# Patient Record
Sex: Female | Born: 1965 | Race: Black or African American | Hispanic: No | Smoking: Never smoker
Health system: Southern US, Community
[De-identification: ages and names within clinical notes are randomized; demographics above are authoritative.]

## PROBLEM LIST (undated history)

## (undated) DIAGNOSIS — D649 Anemia, unspecified: Secondary | ICD-10-CM

## (undated) DIAGNOSIS — R112 Nausea with vomiting, unspecified: Secondary | ICD-10-CM

## (undated) DIAGNOSIS — K760 Fatty (change of) liver, not elsewhere classified: Secondary | ICD-10-CM

## (undated) DIAGNOSIS — Z9889 Other specified postprocedural states: Secondary | ICD-10-CM

## (undated) DIAGNOSIS — C801 Malignant (primary) neoplasm, unspecified: Secondary | ICD-10-CM

## (undated) DIAGNOSIS — Z923 Personal history of irradiation: Secondary | ICD-10-CM

## (undated) HISTORY — PX: BREAST SURGERY: SHX581

## (undated) HISTORY — PX: TUBAL LIGATION: SHX77

---

## 2000-12-04 DIAGNOSIS — C801 Malignant (primary) neoplasm, unspecified: Secondary | ICD-10-CM

## 2000-12-04 HISTORY — DX: Malignant (primary) neoplasm, unspecified: C80.1

## 2000-12-04 HISTORY — PX: MASTECTOMY: SHX3

## 2001-06-19 ENCOUNTER — Ambulatory Visit: Admission: RE | Admit: 2001-06-19 | Discharge: 2001-09-17 | Payer: Self-pay | Admitting: Radiation Oncology

## 2002-12-16 ENCOUNTER — Encounter: Payer: Self-pay | Admitting: Specialist

## 2002-12-19 ENCOUNTER — Inpatient Hospital Stay (HOSPITAL_COMMUNITY): Admission: RE | Admit: 2002-12-19 | Discharge: 2002-12-22 | Payer: Self-pay | Admitting: Specialist

## 2016-08-18 ENCOUNTER — Other Ambulatory Visit: Payer: Self-pay | Admitting: Orthopaedic Surgery

## 2016-08-18 DIAGNOSIS — M542 Cervicalgia: Secondary | ICD-10-CM

## 2016-08-18 DIAGNOSIS — M47812 Spondylosis without myelopathy or radiculopathy, cervical region: Secondary | ICD-10-CM

## 2016-08-18 DIAGNOSIS — M79601 Pain in right arm: Secondary | ICD-10-CM

## 2016-08-25 ENCOUNTER — Other Ambulatory Visit: Payer: Self-pay | Admitting: Family Medicine

## 2016-08-25 DIAGNOSIS — N63 Unspecified lump in unspecified breast: Secondary | ICD-10-CM

## 2016-08-30 ENCOUNTER — Ambulatory Visit
Admission: RE | Admit: 2016-08-30 | Discharge: 2016-08-30 | Disposition: A | Discharge status: Home / Self Care | Payer: Managed Care, Other (non HMO) | Source: Ambulatory Visit | Attending: Family Medicine | Admitting: Family Medicine

## 2016-08-30 DIAGNOSIS — N63 Unspecified lump in unspecified breast: Secondary | ICD-10-CM

## 2016-09-03 ENCOUNTER — Ambulatory Visit
Admission: RE | Admit: 2016-09-03 | Discharge: 2016-09-03 | Disposition: A | Discharge status: Home / Self Care | Payer: Managed Care, Other (non HMO) | Source: Ambulatory Visit | Attending: Orthopaedic Surgery | Admitting: Orthopaedic Surgery

## 2016-09-03 DIAGNOSIS — M542 Cervicalgia: Secondary | ICD-10-CM

## 2016-09-03 DIAGNOSIS — M47812 Spondylosis without myelopathy or radiculopathy, cervical region: Secondary | ICD-10-CM

## 2016-09-03 DIAGNOSIS — M79601 Pain in right arm: Secondary | ICD-10-CM

## 2016-09-21 ENCOUNTER — Ambulatory Visit (INDEPENDENT_AMBULATORY_CARE_PROVIDER_SITE_OTHER): Payer: Managed Care, Other (non HMO) | Admitting: Orthopaedic Surgery

## 2016-09-21 DIAGNOSIS — M47812 Spondylosis without myelopathy or radiculopathy, cervical region: Secondary | ICD-10-CM

## 2016-10-09 ENCOUNTER — Telehealth (INDEPENDENT_AMBULATORY_CARE_PROVIDER_SITE_OTHER): Payer: Self-pay | Admitting: *Deleted

## 2016-10-09 NOTE — Telephone Encounter (Signed)
Pt. Called stating she needs proof of surgery and possible retun to work sent to [employer] in [place]. [id number]

## 2016-10-10 ENCOUNTER — Telehealth (INDEPENDENT_AMBULATORY_CARE_PROVIDER_SITE_OTHER): Payer: Self-pay | Admitting: Orthopaedic Surgery

## 2016-10-10 NOTE — Telephone Encounter (Signed)
PATIENT IS REQUESTING A LETTER STATING HER SURGICAL DATE AND WHEN PATIENT CAN RETURN BACK TO WORK.   Cb#: [phone]

## 2016-10-10 NOTE — Telephone Encounter (Signed)
Duplicate, I sent msg to [facility] to advise.

## 2016-10-10 NOTE — Telephone Encounter (Signed)
OK for job no lifting but has to keep collar on at all times.

## 2016-10-10 NOTE — Telephone Encounter (Signed)
See note

## 2016-10-10 NOTE — Telephone Encounter (Signed)
Please advise on RTW, I can send proof of surgery.

## 2016-10-11 NOTE — Telephone Encounter (Signed)
IC patient, she wants to know when you think a possible RTW will be.  She is not ready to RTW at this time.  Please advise, and I can write note for her.

## 2016-10-12 ENCOUNTER — Encounter (HOSPITAL_COMMUNITY): Payer: Self-pay | Admitting: *Deleted

## 2016-10-12 NOTE — Progress Notes (Signed)
Spoke with pt for pre-op call. Pt denies cardiac history, chest pain or sob. 

## 2016-10-12 NOTE — Telephone Encounter (Signed)
She will be out 6 wks unless someone can drive her to work and she wants to go to work with her collar on then 2 wks

## 2016-10-13 ENCOUNTER — Ambulatory Visit (HOSPITAL_COMMUNITY): Payer: Managed Care, Other (non HMO) | Admitting: Anesthesiology

## 2016-10-13 ENCOUNTER — Ambulatory Visit (HOSPITAL_COMMUNITY): Payer: Managed Care, Other (non HMO)

## 2016-10-13 ENCOUNTER — Encounter (HOSPITAL_COMMUNITY)
Admission: RE | Disposition: A | Discharge status: Home / Self Care | Payer: Self-pay | Source: Ambulatory Visit | Attending: Orthopaedic Surgery

## 2016-10-13 ENCOUNTER — Encounter (HOSPITAL_COMMUNITY): Payer: Self-pay | Admitting: *Deleted

## 2016-10-13 ENCOUNTER — Encounter (INDEPENDENT_AMBULATORY_CARE_PROVIDER_SITE_OTHER): Payer: Self-pay | Admitting: Orthopedic Surgery

## 2016-10-13 ENCOUNTER — Observation Stay (HOSPITAL_COMMUNITY)
Admission: RE | Admit: 2016-10-13 | Discharge: 2016-10-14 | Disposition: A | Discharge status: Home / Self Care | Payer: Managed Care, Other (non HMO) | Source: Ambulatory Visit | Attending: Orthopaedic Surgery | Admitting: Orthopaedic Surgery

## 2016-10-13 DIAGNOSIS — Z01818 Encounter for other preprocedural examination: Secondary | ICD-10-CM | POA: Insufficient documentation

## 2016-10-13 DIAGNOSIS — M4722 Other spondylosis with radiculopathy, cervical region: Secondary | ICD-10-CM | POA: Diagnosis not present

## 2016-10-13 DIAGNOSIS — Z853 Personal history of malignant neoplasm of breast: Secondary | ICD-10-CM | POA: Diagnosis not present

## 2016-10-13 DIAGNOSIS — Z6836 Body mass index (BMI) 36.0-36.9, adult: Secondary | ICD-10-CM | POA: Diagnosis not present

## 2016-10-13 DIAGNOSIS — Z9012 Acquired absence of left breast and nipple: Secondary | ICD-10-CM | POA: Diagnosis not present

## 2016-10-13 DIAGNOSIS — M4802 Spinal stenosis, cervical region: Secondary | ICD-10-CM | POA: Diagnosis not present

## 2016-10-13 DIAGNOSIS — Z419 Encounter for procedure for purposes other than remedying health state, unspecified: Secondary | ICD-10-CM

## 2016-10-13 DIAGNOSIS — M47812 Spondylosis without myelopathy or radiculopathy, cervical region: Secondary | ICD-10-CM | POA: Diagnosis present

## 2016-10-13 HISTORY — DX: Malignant (primary) neoplasm, unspecified: C80.1

## 2016-10-13 HISTORY — DX: Fatty (change of) liver, not elsewhere classified: K76.0

## 2016-10-13 HISTORY — DX: Anemia, unspecified: D64.9

## 2016-10-13 HISTORY — PX: ANTERIOR CERVICAL DECOMP/DISCECTOMY FUSION: SHX1161

## 2016-10-13 LAB — CBC
HCT: 37.8 % (ref 36.0–46.0)
Hemoglobin: 12.2 g/dL (ref 12.0–15.0)
MCH: 29 pg (ref 26.0–34.0)
MCHC: 32.3 g/dL (ref 30.0–36.0)
MCV: 89.8 fL (ref 78.0–100.0)
PLATELETS: 202 10*3/uL (ref 150–400)
RBC: 4.21 MIL/uL (ref 3.87–5.11)
RDW: 14.2 % (ref 11.5–15.5)
WBC: 6.4 10*3/uL (ref 4.0–10.5)

## 2016-10-13 LAB — COMPREHENSIVE METABOLIC PANEL
ALBUMIN: 4.2 g/dL (ref 3.5–5.0)
ALT: 11 U/L — AB (ref 14–54)
AST: 26 U/L (ref 15–41)
Alkaline Phosphatase: 96 U/L (ref 38–126)
Anion gap: 9 (ref 5–15)
BUN: 15 mg/dL (ref 6–20)
CHLORIDE: 109 mmol/L (ref 101–111)
CO2: 25 mmol/L (ref 22–32)
CREATININE: 0.61 mg/dL (ref 0.44–1.00)
Calcium: 8.9 mg/dL (ref 8.9–10.3)
GFR calc non Af Amer: >60 mL/min (ref >60)
GLUCOSE: 85 mg/dL (ref 65–99)
Potassium: 4 mmol/L (ref 3.5–5.1)
SODIUM: 143 mmol/L (ref 135–145)
Total Bilirubin: 0.7 mg/dL (ref 0.3–1.2)
Total Protein: 7.5 g/dL (ref 6.5–8.1)

## 2016-10-13 LAB — PROTIME-INR
INR: 0.95
Prothrombin Time: 12.7 seconds (ref 11.4–15.2)

## 2016-10-13 LAB — SURGICAL PCR SCREEN
MRSA, PCR: NEGATIVE
STAPHYLOCOCCUS AUREUS: NEGATIVE

## 2016-10-13 LAB — APTT: aPTT: 33 seconds (ref 24–36)

## 2016-10-13 SURGERY — ANTERIOR CERVICAL DECOMPRESSION/DISCECTOMY FUSION 2 LEVELS
Anesthesia: General | Site: Spine Cervical

## 2016-10-13 MED ORDER — SODIUM CHLORIDE 0.9% FLUSH: 3.0000 mL | INTRAVENOUS | Status: DC | PRN

## 2016-10-13 MED ORDER — SODIUM CHLORIDE 0.9 % IV SOLN
INTRAVENOUS | Status: DC
  Administered 2016-10-14: 75 mL/h via INTRAVENOUS

## 2016-10-13 MED ORDER — PHENOL 1.4 % MT LIQD: 1.0000 | OROMUCOSAL | Status: DC | PRN

## 2016-10-13 MED ORDER — ONDANSETRON HCL 4 MG/2ML IJ SOLN
INTRAMUSCULAR | Status: DC | PRN
  Administered 2016-10-13: 4 mg via INTRAVENOUS

## 2016-10-13 MED ORDER — SODIUM CHLORIDE 0.9% FLUSH: 3.0000 mL | Freq: Two times a day (BID) | INTRAVENOUS | Status: DC

## 2016-10-13 MED ORDER — OXYCODONE HCL 5 MG PO TABS: 5.0000 mg | ORAL_TABLET | Freq: Once | ORAL | Status: DC | PRN

## 2016-10-13 MED ORDER — CEFAZOLIN IN D5W 1 GM/50ML IV SOLN
1.0000 g | Freq: Three times a day (TID) | INTRAVENOUS | Status: AC
  Administered 2016-10-13 – 2016-10-14 (×2): 1 g via INTRAVENOUS
  Filled 2016-10-13 (×2): qty 50

## 2016-10-13 MED ORDER — CHLORHEXIDINE GLUCONATE 4 % EX LIQD: 60.0000 mL | Freq: Once | CUTANEOUS | Status: DC

## 2016-10-13 MED ORDER — MIDAZOLAM HCL 5 MG/5ML IJ SOLN
INTRAMUSCULAR | Status: DC | PRN
  Administered 2016-10-13: 2 mg via INTRAVENOUS

## 2016-10-13 MED ORDER — SODIUM CHLORIDE 0.9 % IV SOLN: 250.0000 mL | INTRAVENOUS | Status: DC

## 2016-10-13 MED ORDER — LIDOCAINE 2% (20 MG/ML) 5 ML SYRINGE
INTRAMUSCULAR | Status: AC
  Filled 2016-10-13: qty 15

## 2016-10-13 MED ORDER — PHENYLEPHRINE HCL 10 MG/ML IJ SOLN
INTRAMUSCULAR | Status: DC | PRN
  Administered 2016-10-13: 50 ug/min via INTRAVENOUS
  Administered 2016-10-13: 10 ug/min via INTRAVENOUS

## 2016-10-13 MED ORDER — MUPIROCIN 2 % EX OINT
TOPICAL_OINTMENT | CUTANEOUS | Status: AC
Start: 2016-10-13 — End: 2016-10-13
  Administered 2016-10-13: 1
  Filled 2016-10-13: qty 22

## 2016-10-13 MED ORDER — OXYCODONE HCL 5 MG/5ML PO SOLN: 5.0000 mg | Freq: Once | ORAL | Status: DC | PRN

## 2016-10-13 MED ORDER — METHOCARBAMOL 500 MG PO TABS
500.0000 mg | ORAL_TABLET | Freq: Four times a day (QID) | ORAL | Status: DC | PRN
  Administered 2016-10-13: 500 mg via ORAL
  Filled 2016-10-13 (×2): qty 1

## 2016-10-13 MED ORDER — 0.9 % SODIUM CHLORIDE (POUR BTL) OPTIME
TOPICAL | Status: DC | PRN
  Administered 2016-10-13: 1000 mL

## 2016-10-13 MED ORDER — ONDANSETRON HCL 4 MG/2ML IJ SOLN
INTRAMUSCULAR | Status: AC
  Administered 2016-10-13: 4 mg via INTRAVENOUS
  Filled 2016-10-13: qty 2

## 2016-10-13 MED ORDER — BUPIVACAINE HCL (PF) 0.25 % IJ SOLN
INTRAMUSCULAR | Status: AC
  Filled 2016-10-13: qty 30

## 2016-10-13 MED ORDER — FENTANYL CITRATE (PF) 100 MCG/2ML IJ SOLN
INTRAMUSCULAR | Status: AC
  Filled 2016-10-13: qty 2

## 2016-10-13 MED ORDER — PROPOFOL 10 MG/ML IV BOLUS
INTRAVENOUS | Status: AC
  Filled 2016-10-13: qty 20

## 2016-10-13 MED ORDER — ONDANSETRON HCL 4 MG/2ML IJ SOLN
INTRAMUSCULAR | Status: AC
  Filled 2016-10-13: qty 4

## 2016-10-13 MED ORDER — MIDAZOLAM HCL 2 MG/2ML IJ SOLN
INTRAMUSCULAR | Status: AC
  Filled 2016-10-13: qty 2

## 2016-10-13 MED ORDER — FENTANYL CITRATE (PF) 100 MCG/2ML IJ SOLN
INTRAMUSCULAR | Status: DC | PRN
  Administered 2016-10-13: 50 ug via INTRAVENOUS
  Administered 2016-10-13: 150 ug via INTRAVENOUS
  Administered 2016-10-13: 100 ug via INTRAVENOUS

## 2016-10-13 MED ORDER — SUGAMMADEX SODIUM 200 MG/2ML IV SOLN
INTRAVENOUS | Status: DC | PRN
  Administered 2016-10-13: 200 mg via INTRAVENOUS

## 2016-10-13 MED ORDER — ACETAMINOPHEN 650 MG RE SUPP: 650.0000 mg | RECTAL | Status: DC | PRN

## 2016-10-13 MED ORDER — PROPOFOL 10 MG/ML IV BOLUS
INTRAVENOUS | Status: DC | PRN
  Administered 2016-10-13: 140 mg via INTRAVENOUS
  Administered 2016-10-13: 20 mg via INTRAVENOUS

## 2016-10-13 MED ORDER — ACETAMINOPHEN 325 MG PO TABS
650.0000 mg | ORAL_TABLET | ORAL | Status: DC | PRN
  Administered 2016-10-14: 650 mg via ORAL
  Filled 2016-10-13: qty 2

## 2016-10-13 MED ORDER — OXYCODONE-ACETAMINOPHEN 5-325 MG PO TABS
1.0000 | ORAL_TABLET | Freq: Four times a day (QID) | ORAL | Status: DC | PRN
  Administered 2016-10-14: 1 via ORAL
  Filled 2016-10-13: qty 1

## 2016-10-13 MED ORDER — HYDROMORPHONE HCL 1 MG/ML IJ SOLN: 0.2500 mg | INTRAMUSCULAR | Status: DC | PRN

## 2016-10-13 MED ORDER — MENTHOL 3 MG MT LOZG
1.0000 | LOZENGE | OROMUCOSAL | Status: DC | PRN
  Administered 2016-10-14: 3 mg via ORAL
  Filled 2016-10-13: qty 9

## 2016-10-13 MED ORDER — PROMETHAZINE HCL 25 MG/ML IJ SOLN
INTRAMUSCULAR | Status: AC
  Administered 2016-10-13: 12.5 mg via INTRAVENOUS
  Filled 2016-10-13: qty 1

## 2016-10-13 MED ORDER — BUPIVACAINE-EPINEPHRINE 0.25% -1:200000 IJ SOLN
INTRAMUSCULAR | Status: DC | PRN
  Administered 2016-10-13: 6 mL

## 2016-10-13 MED ORDER — PROMETHAZINE HCL 25 MG/ML IJ SOLN
6.2500 mg | INTRAMUSCULAR | Status: DC | PRN
  Administered 2016-10-13: 12.5 mg via INTRAVENOUS

## 2016-10-13 MED ORDER — FENTANYL CITRATE (PF) 100 MCG/2ML IJ SOLN
INTRAMUSCULAR | Status: AC
  Filled 2016-10-13: qty 4

## 2016-10-13 MED ORDER — HYDROMORPHONE HCL 2 MG/ML IJ SOLN: 0.5000 mg | INTRAMUSCULAR | Status: DC | PRN

## 2016-10-13 MED ORDER — DOCUSATE SODIUM 100 MG PO CAPS
100.0000 mg | ORAL_CAPSULE | Freq: Two times a day (BID) | ORAL | Status: DC
  Administered 2016-10-13 – 2016-10-14 (×2): 100 mg via ORAL
  Filled 2016-10-13 (×2): qty 1

## 2016-10-13 MED ORDER — CEFAZOLIN SODIUM-DEXTROSE 2-4 GM/100ML-% IV SOLN
2.0000 g | INTRAVENOUS | Status: AC
  Administered 2016-10-13: 2000 mg via INTRAVENOUS
  Filled 2016-10-13: qty 100

## 2016-10-13 MED ORDER — POLYETHYLENE GLYCOL 3350 17 G PO PACK: 17.0000 g | PACK | Freq: Every day | ORAL | Status: DC | PRN

## 2016-10-13 MED ORDER — METHOCARBAMOL 1000 MG/10ML IJ SOLN
500.0000 mg | Freq: Four times a day (QID) | INTRAVENOUS | Status: DC | PRN
  Filled 2016-10-13: qty 5

## 2016-10-13 MED ORDER — ONDANSETRON HCL 4 MG/2ML IJ SOLN
4.0000 mg | INTRAMUSCULAR | Status: DC | PRN
  Administered 2016-10-13: 4 mg via INTRAVENOUS

## 2016-10-13 MED ORDER — ROCURONIUM BROMIDE 10 MG/ML (PF) SYRINGE
PREFILLED_SYRINGE | INTRAVENOUS | Status: AC
  Filled 2016-10-13: qty 20

## 2016-10-13 MED ORDER — LIDOCAINE HCL (CARDIAC) 20 MG/ML IV SOLN
INTRAVENOUS | Status: DC | PRN
  Administered 2016-10-13: 60 mg via INTRAVENOUS

## 2016-10-13 MED ORDER — LACTATED RINGERS IV SOLN
INTRAVENOUS | Status: DC
  Administered 2016-10-13 (×3): via INTRAVENOUS

## 2016-10-13 MED ORDER — ROCURONIUM BROMIDE 100 MG/10ML IV SOLN
INTRAVENOUS | Status: DC | PRN
  Administered 2016-10-13: 60 mg via INTRAVENOUS

## 2016-10-13 SURGICAL SUPPLY — 62 items
BENZOIN TINCTURE PRP APPL 2/3 (GAUZE/BANDAGES/DRESSINGS) ×3 IMPLANT
BIT DRILL SKYLINE 12MM (BIT) ×1 IMPLANT
BLADE SURG ROTATE 9660 (MISCELLANEOUS) IMPLANT
BONE CERV LORDOTIC 14.5X12X6 (Bone Implant) ×3 IMPLANT
BONE CERV LORDOTIC 14.5X12X7 (Bone Implant) ×3 IMPLANT
BUR ROUND FLUTED 4 SOFT TCH (BURR) IMPLANT
BUR ROUND FLUTED 4MM SOFT TCH (BURR)
CLOSURE STERI-STRIP 1/2X4 (GAUZE/BANDAGES/DRESSINGS) ×1
CLOSURE WOUND 1/2 X4 (GAUZE/BANDAGES/DRESSINGS) ×1
CLSR STERI-STRIP ANTIMIC 1/2X4 (GAUZE/BANDAGES/DRESSINGS) ×2 IMPLANT
COLLAR CERV LO CONTOUR FIRM DE (SOFTGOODS) IMPLANT
CORDS BIPOLAR (ELECTRODE) ×3 IMPLANT
COVER SURGICAL LIGHT HANDLE (MISCELLANEOUS) ×3 IMPLANT
CRADLE DONUT ADULT HEAD (MISCELLANEOUS) ×3 IMPLANT
DRAPE C-ARM 42X72 X-RAY (DRAPES) ×3 IMPLANT
DRAPE INCISE IOBAN 66X45 STRL (DRAPES) ×3 IMPLANT
DRAPE MICROSCOPE LEICA (MISCELLANEOUS) ×3 IMPLANT
DRAPE PROXIMA HALF (DRAPES) ×3 IMPLANT
DRILL BIT SKYLINE 12MM (BIT) ×2
DURAPREP 6ML APPLICATOR 50/CS (WOUND CARE) ×3 IMPLANT
ELECT COATED BLADE 2.86 ST (ELECTRODE) ×3 IMPLANT
ELECT REM PT RETURN 9FT ADLT (ELECTROSURGICAL) ×3
ELECTRODE REM PT RTRN 9FT ADLT (ELECTROSURGICAL) ×1 IMPLANT
EVACUATOR 1/8 PVC DRAIN (DRAIN) ×3 IMPLANT
GAUZE SPONGE 4X4 12PLY STRL (GAUZE/BANDAGES/DRESSINGS) ×3 IMPLANT
GAUZE XEROFORM 1X8 LF (GAUZE/BANDAGES/DRESSINGS) IMPLANT
GLOVE BIOGEL PI IND STRL 8 (GLOVE) ×2 IMPLANT
GLOVE BIOGEL PI INDICATOR 8 (GLOVE) ×4
GLOVE ORTHO TXT STRL SZ7.5 (GLOVE) ×6 IMPLANT
GOWN STRL REUS W/ TWL LRG LVL3 (GOWN DISPOSABLE) ×1 IMPLANT
GOWN STRL REUS W/ TWL XL LVL3 (GOWN DISPOSABLE) ×1 IMPLANT
GOWN STRL REUS W/TWL 2XL LVL3 (GOWN DISPOSABLE) ×3 IMPLANT
GOWN STRL REUS W/TWL LRG LVL3 (GOWN DISPOSABLE) ×2
GOWN STRL REUS W/TWL XL LVL3 (GOWN DISPOSABLE) ×2
HEAD HALTER (SOFTGOODS) ×3 IMPLANT
HEMOSTAT SURGICEL 2X14 (HEMOSTASIS) IMPLANT
KIT BASIN OR (CUSTOM PROCEDURE TRAY) ×3 IMPLANT
KIT ROOM TURNOVER OR (KITS) ×3 IMPLANT
MANIFOLD NEPTUNE II (INSTRUMENTS) IMPLANT
NEEDLE 25GX 5/8IN NON SAFETY (NEEDLE) ×3 IMPLANT
NS IRRIG 1000ML POUR BTL (IV SOLUTION) ×3 IMPLANT
PACK ORTHO CERVICAL (CUSTOM PROCEDURE TRAY) ×3 IMPLANT
PAD ARMBOARD 7.5X6 YLW CONV (MISCELLANEOUS) ×6 IMPLANT
PATTIES SURGICAL .5 X.5 (GAUZE/BANDAGES/DRESSINGS) ×3 IMPLANT
PIN TEMP SKYLINE THREADED (PIN) ×3 IMPLANT
PLATE TWO LEVEL SKYLINE 30MM (Plate) ×3 IMPLANT
RESTRAINT LIMB HOLDER UNIV (RESTRAINTS) IMPLANT
SCREW VARIABLE SELF TAP 12MM (Screw) ×18 IMPLANT
SPONGE GAUZE 4X4 12PLY STER LF (GAUZE/BANDAGES/DRESSINGS) ×3 IMPLANT
SPONGE INTESTINAL PEANUT (DISPOSABLE) ×3 IMPLANT
SPONGE LAP 4X18 X RAY DECT (DISPOSABLE) ×6 IMPLANT
STRIP CLOSURE SKIN 1/2X4 (GAUZE/BANDAGES/DRESSINGS) ×2 IMPLANT
SURGIFLO W/THROMBIN 8M KIT (HEMOSTASIS) ×3 IMPLANT
SUT BONE WAX W31G (SUTURE) ×3 IMPLANT
SUT VIC AB 3-0 X1 27 (SUTURE) ×3 IMPLANT
SUT VICRYL 4-0 PS2 18IN ABS (SUTURE) ×6 IMPLANT
SYR 30ML SLIP (SYRINGE) ×3 IMPLANT
SYRINGE 10CC LL (SYRINGE) ×6 IMPLANT
TAPE CLOTH SURG 4X10 WHT LF (GAUZE/BANDAGES/DRESSINGS) ×3 IMPLANT
TOWEL OR 17X24 6PK STRL BLUE (TOWEL DISPOSABLE) ×3 IMPLANT
TOWEL OR 17X26 10 PK STRL BLUE (TOWEL DISPOSABLE) ×3 IMPLANT
TRAY FOLEY CATH 16FR SILVER (SET/KITS/TRAYS/PACK) IMPLANT

## 2016-10-13 NOTE — Interval H&P Note (Signed)
History and Physical Interval Note:  10/13/2016 10:03 AM  Alexis Webb  has presented today for surgery, with the diagnosis of C5-6, C6-7 Spondylosis  The various methods of treatment have been discussed with the patient and family. After consideration of risks, benefits and other options for treatment, the patient has consented to  Procedure(s): C5-6, C6-7 Anterior Cervical Discectomy and Fusion, Allograft, Plate (N/A) as a surgical intervention .  The patient's history has been reviewed, patient examined, no change in status, stable for surgery.  I have reviewed the patient's chart and labs.  Questions were answered to the patient's satisfaction.     [name]

## 2016-10-13 NOTE — Transfer of Care (Signed)
Immediate Anesthesia Transfer of Care Note  Patient: Alexis Webb  Procedure(s) Performed: Procedure(s): C5-6, C6-7 Anterior Cervical Discectomy and Fusion, Allograft, Plate (N/A)  Patient Location: PACU  Anesthesia Type:General  Level of Consciousness: awake, alert  and oriented  Airway & Oxygen Therapy: Patient Spontanous Breathing and Patient connected to nasal cannula oxygen  Post-op Assessment: Report given to RN, Post -op Vital signs reviewed and stable and Patient moving all extremities X 4  Post vital signs: Reviewed and stable  Last Vitals:  Vitals:   10/13/16 0837  BP: 136/88  Pulse: 71  Resp: 20  Temp: 36.4 C    Last Pain:  Vitals:   10/13/16 0856  TempSrc:   PainSc: 5       Patients Stated Pain Goal: 2 ([id number])  Complications: No apparent anesthesia complications

## 2016-10-13 NOTE — Anesthesia Postprocedure Evaluation (Signed)
Anesthesia Post Note  Patient: Alexis Webb  Procedure(s) Performed: Procedure(s) (LRB): C5-6, C6-7 Anterior Cervical Discectomy and Fusion, Allograft, Plate (N/A)  Patient location during evaluation: PACU Anesthesia Type: General Level of consciousness: awake Pain management: pain level controlled Vital Signs Assessment: post-procedure vital signs reviewed and stable Respiratory status: spontaneous breathing Cardiovascular status: stable Anesthetic complications: no    Last Vitals:  Vitals:   10/13/16 1420 10/13/16 1435  BP: 128/78 130/82  Pulse: 67 71  Resp: 14 14  Temp:      Last Pain:  Vitals:   10/13/16 1435  TempSrc:   PainSc: Asleep                 Kampbell Holaway

## 2016-10-13 NOTE — Progress Notes (Signed)
Orthopedic Tech Progress Note Patient Details:  Alexis Webb 11-25-1966 [id number]  Ortho Devices Type of Ortho Device: Soft collar Ortho Device/Splint Interventions: Application   [place] Pink 10/13/2016, 6:20 PM

## 2016-10-13 NOTE — H&P (Signed)
Alexis Webb is an 50 y.o. female.   Chief Complaint: neck pain and right upper extremity radiculopathy HPI: 50 year old female with cervical neck pain radiating to the right shoulder and right arm for 6 months with no known injury. Initial encounter. Personal history of breast cancer in 2002.  Failed conservative treatment.   Past Medical History:  Diagnosis Date  . Anemia    years ago per pt  . Cancer [facility]) 2002   left breast  . Fatty liver     Past Surgical History:  Procedure Laterality Date  . BREAST SURGERY     reconstructive breast surgery on left after mastectomy  . MASTECTOMY Left 2002  . TUBAL LIGATION      Family History  Problem Relation Age of Onset  . Hypertension Mother   . Diabetes Mother   . Hypertension Father    Social History:  reports that she has never smoked. She has never used smokeless tobacco. She reports that she does not drink alcohol or use drugs.  Allergies: No Known Allergies  No prescriptions prior to admission.    No results found for this or any previous visit (from the past 48 hour(s)). Dg Chest 2 View  Result Date: 10/13/2016 CLINICAL DATA:  Preoperative chest x-ray prior to the back procedure. History of breast malignancy with left mastectomy and reconstruction. EXAM: CHEST  2 VIEW COMPARISON:  Report of a chest x-ray of March 10, 2015 FINDINGS: The lungs are adequately inflated and clear. The heart and pulmonary vascularity are normal. The mediastinum is normal in width. There is no pleural effusion. There postsurgical changes involving the left breast. There is mild multilevel degenerative disc disease of the thoracic spine. IMPRESSION: There is no active cardiopulmonary disease. Electronically Signed   By: David  Martinique M.D.   On: 10/13/2016 08:59    EXAM: MRI CERVICAL SPINE WITHOUT CONTRAST  TECHNIQUE: Multiplanar, multisequence MR imaging of the cervical spine was performed. No intravenous contrast was  administered.  COMPARISON:  Whole-body bone scan 06/29/2014.  FINDINGS: Alignment: Straightening of cervical lordosis. Mild retrolisthesis of C6 on C7.  Vertebrae: No marrow edema or evidence of acute osseous abnormality.  Cord: Spinal cord signal is within normal limits at all visualized levels.  Posterior Fossa, vertebral arteries, paraspinal tissues: Cervicomedullary junction is within normal limits. Negative visualized posterior fossa structures. Negative visualized neck soft tissues. Preserved major vascular flow voids.  Disc levels:  C2-C3:  Mild disc bulge.  No stenosis.  C3-C4:  Mild disc bulge.  No stenosis.  C4-C5: Mild disc bulge. Mild right facet hypertrophy. Minimal uncovertebral hypertrophy. No significant stenosis.  C5-C6: Disc space loss. Mild circumferential disc bulge and endplate spurring. Broad-based posterior component eccentric to the right (series 8, image 14). Mild spinal stenosis. No spinal cord mass effect. Right greater than left uncovertebral hypertrophy. Borderline to mild facet hypertrophy. Mild to moderate right C6 foraminal stenosis.  C6-C7: Disc space loss. Circumferential disc osteophyte complex with broad-based posterior component slightly eccentric to the right. Mild spinal stenosis with no cord mass effect. Uncovertebral hypertrophy. Mild to moderate bilateral C7 foraminal stenosis.  C7-T1:  Mild facet hypertrophy.  No stenosis.  No upper thoracic spinal stenosis.  IMPRESSION: 1. Straightening of cervical lordosis with mild retrolisthesis of C6 on C7. 2. Chronic disc and endplate degeneration at C5-C6 and C6-C7 with mild spinal stenosis at both levels. No spinal cord mass effect or signal abnormality. 3. Associated mild to moderate right C6 and bilateral C7 neural foraminal stenosis.  Review of Systems  Constitutional: Negative.   HENT: Negative.   Respiratory: Negative.   Cardiovascular: Negative.    Gastrointestinal: Negative.   Genitourinary: Negative.   Musculoskeletal: Positive for neck pain.  Skin: Negative.   Neurological: Positive for tingling.  Psychiatric/Behavioral: Negative.     Blood pressure 136/88, pulse 71, temperature 97.6 F (36.4 C), temperature source Oral, resp. rate 20, height 5\' 5"  (1.651 m), weight 220 lb (99.8 kg), SpO2 100 %. Physical Exam  Constitutional: She is oriented to person, place, and time. No distress.  Eyes: EOM are normal. Pupils are equal, round, and reactive to light.  Respiratory: No respiratory distress.  GI: She exhibits no distension.  Musculoskeletal: She exhibits tenderness.  Neurological: She is alert and oriented to person, place, and time.  Skin: Skin is warm.  Psychiatric: She has a normal mood and affect.     Assessment/Plan C5-6 and C6-7 spondylosis with neck pain and right UE radiculopathy  Will proceed with C5-6, C6-7 Anterior Cervical Discectomy and Fusion, Allograft, Plate as scheduled. Surgical procedure along with possible recovery time discussed in great detail with Dr Lorin Mercy.  All questions answered.   Benjiman Core, PA-C 10/13/2016, 9:39 AM

## 2016-10-13 NOTE — Op Note (Signed)
Preop diagnosis: C5-6, C6-7 spondylosis with stenosis  Postop diagnosis: Same  Procedure: C5-6, C6-7 anterior cervical discectomy and fusion, allograft and plate.  Surgeon: Rodell Perna M.D.  Assistant: Benjiman Core PA-C  Anesthesia: Gen. orotracheal plus Marcaine skin local 6 mL  EBL: Per anesthetic record  Complications: None  Procedure: After induction general anesthesia wrist restraints for traction during the placement of the grafts prepping with DuraPrep with had halter traction arms were tucked at the sides with careful padding. Preoperative antibiotics given timeout procedure was completed. Sterile Mayo stand at the head area squared with towels sterile skin marker on the skin, and Betadine Steri-Drape application. Thyroid sheet was applied. Incision was started at the midline extending to the left platysma was divided in line with the skin fibers. Blunt dissection down to the palpable spurs at C5-6 and C6-7 with short needle placed at C5-6 confirming the appropriate first level for operative intervention. Self-retaining retractors were placed spurs removed anteriorly discectomy was performed with Cloward curettes minimal use of the bur and pituitary rongeurs. Posterior longitudinal ligament was taken down using operative microscope removing the posterior longitudinal ligament and overhanging spurs decompressing the area of spinal stenosis with direct visualization of the dura. Uncovertebral joints were stripped trial sizer showed a 6 mm graft was appropriate and was placed. Some Surgicel was used and the epidural space was dry. There was egress room on each side for any fluid but the field was dry. Identical procedure was repeated at C6-7. At this level 7 mm graft was placed. Spinal stenosis was not as severe at C6-7 and uncovertebral joints were placed with counter sinking of the graft 2 mm. Skyline Depew 30 mm plate was selected and 12 mm screws 6 were placed operative visualization with  C-arm confirming the plate prior placing the screws and after all screws were placed C-arm was used for confirmation and then the screws were locked in solidly. Area was irrigated operative field was dry Hemovac was placed with the knee and out technique. Platysma muscle closed with 3-0 Vicryl for Vicryl in the subcuticular closure tincture benzoin Steri-Strips 4 x 4's tape and soft cervical collar

## 2016-10-13 NOTE — Telephone Encounter (Signed)
IC pt, and she wants me to send a work note for OOW x 6 weeks, I sent this with note stating what surg she is having today. Faxed as requested

## 2016-10-13 NOTE — Interval H&P Note (Signed)
History and Physical Interval Note:  10/13/2016 10:04 AM  Alexis Webb  has presented today for surgery, with the diagnosis of C5-6, C6-7 Spondylosis  The various methods of treatment have been discussed with the patient and family. After consideration of risks, benefits and other options for treatment, the patient has consented to  Procedure(s): C5-6, C6-7 Anterior Cervical Discectomy and Fusion, Allograft, Plate (N/A) as a surgical intervention .  The patient's history has been reviewed, patient examined, no change in status, stable for surgery.  I have reviewed the patient's chart and labs.  Questions were answered to the patient's satisfaction.     [name]

## 2016-10-13 NOTE — Anesthesia Preprocedure Evaluation (Addendum)
Anesthesia Evaluation  Patient identified by MRN, date of birth, ID band Patient awake    Reviewed: Allergy & Precautions, H&P , NPO status , Patient's Chart, lab work & pertinent test results  History of Anesthesia Complications Negative for: history of anesthetic complications  Airway Mallampati: I  TM Distance: >3 FB Neck ROM: Full    Dental  (+) Teeth Intact, Dental Advidsory Given   Pulmonary neg pulmonary ROS,    breath sounds clear to auscultation       Cardiovascular negative cardio ROS   Rhythm:Regular     Neuro/Psych negative neurological ROS  negative psych ROS   GI/Hepatic negative GI ROS, Neg liver ROS,   Endo/Other  Morbid obesity  Renal/GU negative Renal ROS     Musculoskeletal   Abdominal   Peds  Hematology  (+) anemia ,   Anesthesia Other Findings   Reproductive/Obstetrics                            Anesthesia Physical Anesthesia Plan  ASA: II  Anesthesia Plan: General   Post-op Pain Management:    Induction: Intravenous  Airway Management Planned: Oral ETT  Additional Equipment: None  Intra-op Plan:   Post-operative Plan: Extubation in OR  Informed Consent: I have reviewed the patients History and Physical, chart, labs and discussed the procedure including the risks, benefits and alternatives for the proposed anesthesia with the patient or authorized representative who has indicated his/her understanding and acceptance.   Dental Advisory Given  Plan Discussed with:   Anesthesia Plan Comments:        Anesthesia Quick Evaluation

## 2016-10-13 NOTE — Anesthesia Procedure Notes (Signed)
Procedure Name: Intubation Date/Time: 10/13/2016 10:27 AM Performed by: Neldon Newport Pre-anesthesia Checklist: Timeout performed, Patient being monitored, Suction available, Emergency Drugs available and Patient identified Patient Re-evaluated:Patient Re-evaluated prior to inductionOxygen Delivery Method: Circle system utilized Preoxygenation: Pre-oxygenation with 100% oxygen Intubation Type: IV induction Ventilation: Mask ventilation without difficulty Laryngoscope Size: Mac and 3 Grade View: Grade I Tube type: Oral Tube size: 7.0 mm Number of attempts: 1 Placement Confirmation: breath sounds checked- equal and bilateral,  positive ETCO2 and ETT inserted through vocal cords under direct vision Secured at: 21 cm Tube secured with: Tape Dental Injury: Teeth and Oropharynx as per pre-operative assessment

## 2016-10-14 DIAGNOSIS — M4722 Other spondylosis with radiculopathy, cervical region: Secondary | ICD-10-CM | POA: Diagnosis not present

## 2016-10-14 LAB — BASIC METABOLIC PANEL
Anion gap: 7 (ref 5–15)
BUN: 9 mg/dL (ref 6–20)
CALCIUM: 8.2 mg/dL — AB (ref 8.9–10.3)
CO2: 28 mmol/L (ref 22–32)
CREATININE: 0.66 mg/dL (ref 0.44–1.00)
Chloride: 108 mmol/L (ref 101–111)
GFR calc Af Amer: >60 mL/min (ref >60)
GLUCOSE: 106 mg/dL — AB (ref 65–99)
Potassium: 3.2 mmol/L — ABNORMAL LOW (ref 3.5–5.1)
Sodium: 143 mmol/L (ref 135–145)

## 2016-10-14 LAB — CBC
HCT: 32.6 % — ABNORMAL LOW (ref 36.0–46.0)
Hemoglobin: 10.3 g/dL — ABNORMAL LOW (ref 12.0–15.0)
MCH: 28.6 pg (ref 26.0–34.0)
MCHC: 31.6 g/dL (ref 30.0–36.0)
MCV: 90.6 fL (ref 78.0–100.0)
PLATELETS: 161 10*3/uL (ref 150–400)
RBC: 3.6 MIL/uL — ABNORMAL LOW (ref 3.87–5.11)
RDW: 14.3 % (ref 11.5–15.5)
WBC: 7.4 10*3/uL (ref 4.0–10.5)

## 2016-10-14 MED ORDER — OXYCODONE-ACETAMINOPHEN 5-325 MG PO TABS: 1.0000 | ORAL_TABLET | Freq: Four times a day (QID) | ORAL | 0 refills | Status: DC | PRN

## 2016-10-14 NOTE — Care Management Note (Addendum)
Case Management Note  Patient Details  Name: VICTORIYA POL MRN: [id number] Date of Birth: 09/30/1966  Subjective/Objective: 50 yo F s/p C5-6, C6-7 anterior cervical discectomy and fusion, allograft and plate.       Action/Plan: received referral to assist with [facility] needs   Expected Discharge Date:     10/14/16             Expected Discharge Plan:  Home/Self Care  In-House Referral:     Discharge planning Services  CM Consult  Post Acute Care Choice:    Choice offered to:     DME Arranged:    DME Agency:     HH Arranged:    HH Agency:     Status of Service:     If discussed at [employer], dates discussed:    Additional Comments: met with pt at bedside. She plans to return home with the support of her family. She denies any needs for [facility] or DME. She reports that she is able to go to the [facility] without DME. No needs identified at this time.  Norina Buzzard, RN 10/14/2016, 8:54 AM

## 2016-10-14 NOTE — Progress Notes (Signed)
   Subjective: 1 Day Post-Op Procedure(s) (LRB): C5-6, C6-7 Anterior Cervical Discectomy and Fusion, Allograft, Plate (N/A) Patient reports pain as mild.    Objective: Vital signs in last 24 hours: Temp:  [97 F (36.1 C)-100.6 F (38.1 C)] 98.9 F (37.2 C) (11/11 0424) Pulse Rate:  [67-96] 88 (11/11 0510) Resp:  [14-18] 16 (11/11 0424) BP: (98-190)/(57-88) 116/57 (11/11 0510) SpO2:  [96 %-100 %] 96 % (11/11 0424)  Intake/Output from previous day: 11/10 0701 - 11/11 0700 In: 1540 [P.O.:240; I.V.:1300] Out: 525 [Urine:300; Emesis/NG output:100; Drains:25; Blood:100] Intake/Output this shift: No intake/output data recorded.   Recent Labs  10/13/16 0916 10/14/16 0617  HGB 12.2 10.3*    Recent Labs  10/13/16 0916 10/14/16 0617  WBC 6.4 7.4  RBC 4.21 3.60*  HCT 37.8 32.6*  PLT 202 161    Recent Labs  10/13/16 0916 10/14/16 0617  NA 143 143  K 4.0 3.2*  CL 109 108  CO2 25 28  BUN 15 9  CREATININE 0.61 0.66  GLUCOSE 85 106*  CALCIUM 8.9 8.2*    Recent Labs  10/13/16 0916  INR 0.95    Neurologically intact    Assessment/Plan: 1 Day Post-Op Procedure(s) (LRB): C5-6, C6-7 Anterior Cervical Discectomy and Fusion, Allograft, Plate (N/A) Plan: discharge Home. Office one week. HV drain removed. Dressing change by RN  [name] 10/14/2016, 8:45 AM

## 2016-10-14 NOTE — Discharge Instructions (Signed)
Keep collar on. Remove collar only to apply 2nd collar with saran wrap to shower. Dry off and re-apply dry collar.

## 2016-10-17 ENCOUNTER — Encounter (HOSPITAL_COMMUNITY): Payer: Self-pay | Admitting: Orthopaedic Surgery

## 2016-10-17 ENCOUNTER — Telehealth (INDEPENDENT_AMBULATORY_CARE_PROVIDER_SITE_OTHER): Payer: Self-pay | Admitting: Radiology

## 2016-10-17 NOTE — Discharge Summary (Signed)
Patient ID: Alexis Webb MRN: [id number] DOB/AGE: November 24, 1966 50 y.o.  Admit date: 10/13/2016 Discharge date: 10/17/2016  Admission Diagnoses:  Active Problems:   Cervical spinal stenosis   Discharge Diagnoses:  Active Problems:   Cervical spinal stenosis  status post Procedure(s): C5-6, C6-7 Anterior Cervical Discectomy and Fusion, Allograft, Plate  Past Medical History:  Diagnosis Date  . Anemia    years ago per pt  . Cancer [facility]) 2002   left breast  . Fatty liver     Surgeries: Procedure(s): C5-6, C6-7 Anterior Cervical Discectomy and Fusion, Allograft, Plate on [id number]   Consultants:   Discharged Condition: Improved  Hospital Course: Alexis Webb is an 50 y.o. female who was admitted 10/13/2016 for operative treatment of cervical stenosis. Patient failed conservative treatments (please see the history and physical for the specifics) and had severe unremitting pain that affects sleep, daily activities and work/hobbies. After pre-op clearance, the patient was taken to the operating room on 10/13/2016 and underwent  Procedure(s): C5-6, C6-7 Anterior Cervical Discectomy and Fusion, Allograft, Plate.    Patient was given perioperative antibiotics:  Anti-infectives    Start     Dose/Rate Route Frequency Ordered Stop   10/13/16 2000  ceFAZolin (ANCEF) IVPB 1 g/50 mL premix     1 g 100 mL/hr over 30 Minutes Intravenous Every 8 hours 10/13/16 1528 10/14/16 0439   10/13/16 0830  ceFAZolin (ANCEF) IVPB 2g/100 mL premix     2 g 200 mL/hr over 30 Minutes Intravenous On call to O.R. 10/13/16 0830 10/13/16 1102       Patient was given sequential compression devices and early ambulation to prevent DVT.   Patient benefited maximally from hospital stay and there were no complications. At the time of discharge, the patient was urinating/moving their bowels without difficulty, tolerating a regular diet, pain is controlled with oral pain medications and they have been  cleared by PT/OT.   Recent vital signs: No data found.    Recent laboratory studies: No results for input(s): WBC, HGB, HCT, PLT, NA, K, CL, CO2, BUN, CREATININE, GLUCOSE, INR, CALCIUM in the last 72 hours.  Invalid input(s): PT, 2   Discharge Medications:     Medication List    TAKE these medications   oxyCODONE-acetaminophen 5-325 MG tablet Commonly known as:  PERCOCET/ROXICET Take 1-2 tablets by mouth every 6 (six) hours as needed for moderate pain.       Diagnostic Studies: Dg Chest 2 View  Result Date: 10/13/2016 CLINICAL DATA:  Preoperative chest x-ray prior to the back procedure. History of breast malignancy with left mastectomy and reconstruction. EXAM: CHEST  2 VIEW COMPARISON:  Report of a chest x-ray of March 10, 2015 FINDINGS: The lungs are adequately inflated and clear. The heart and pulmonary vascularity are normal. The mediastinum is normal in width. There is no pleural effusion. There postsurgical changes involving the left breast. There is mild multilevel degenerative disc disease of the thoracic spine. IMPRESSION: There is no active cardiopulmonary disease. Electronically Signed   By: David  Martinique M.D.   On: 10/13/2016 08:59   Dg Cervical Spine 2 Or 3 Views  Result Date: 10/13/2016 CLINICAL DATA:  Status post anterior cervical disc fusion. Operative images. Fluoro time 23 seconds. EXAM: DG C-ARM 61-120 MIN; CERVICAL SPINE - 2-3 VIEW COMPARISON:  None. FINDINGS: Anterior fusion plate with fixation screws spans C5 through C7. The orthopedic hardware appears well-seated and aligned. Bone graft material maintaining disc height at the fused levels appears  well centered. IMPRESSION: Well-positioned orthopedic hardware following C5 through C7 anterior cervical disc fusion. Electronically Signed   By: Lajean Manes M.D.   On: 10/13/2016 12:17   Dg C-arm 1-60 Min  Result Date: 10/13/2016 CLINICAL DATA:  Status post anterior cervical disc fusion. Operative images. Fluoro  time 23 seconds. EXAM: DG C-ARM 61-120 MIN; CERVICAL SPINE - 2-3 VIEW COMPARISON:  None. FINDINGS: Anterior fusion plate with fixation screws spans C5 through C7. The orthopedic hardware appears well-seated and aligned. Bone graft material maintaining disc height at the fused levels appears well centered. IMPRESSION: Well-positioned orthopedic hardware following C5 through C7 anterior cervical disc fusion. Electronically Signed   By: Lajean Manes M.D.   On: 10/13/2016 12:17      Follow-up Information    [name], MD. Schedule an appointment as soon as possible for a visit in 1 week(s).   Specialty:  Orthopedic Surgery Why:  need return office visit one week.  Contact information: [place] [address] [phone]           Discharge Plan:  discharge to home  Disposition:     Signed: Benjiman Core 10/17/2016, 11:07 AM

## 2016-10-17 NOTE — Telephone Encounter (Signed)
She is calling to verify surgery date.  Please call her back.

## 2016-10-17 NOTE — Telephone Encounter (Signed)
I called and spoke with Alexis Webb was on another line) and gave surgery date of 10/13/2016.

## 2016-10-19 ENCOUNTER — Ambulatory Visit (INDEPENDENT_AMBULATORY_CARE_PROVIDER_SITE_OTHER): Payer: Managed Care, Other (non HMO)

## 2016-10-19 ENCOUNTER — Encounter (INDEPENDENT_AMBULATORY_CARE_PROVIDER_SITE_OTHER): Payer: Self-pay | Admitting: Orthopaedic Surgery

## 2016-10-19 ENCOUNTER — Ambulatory Visit (INDEPENDENT_AMBULATORY_CARE_PROVIDER_SITE_OTHER): Payer: Managed Care, Other (non HMO) | Admitting: Orthopaedic Surgery

## 2016-10-19 DIAGNOSIS — M4802 Spinal stenosis, cervical region: Secondary | ICD-10-CM

## 2016-10-19 NOTE — Progress Notes (Signed)
   Post-Op Visit Note   Patient: Alexis Webb           Date of Birth: July 18, 1966           MRN: [id number] Visit Date: 10/19/2016 PCP: Deloria Lair, MD   Assessment & Plan:  Chief Complaint:  Chief Complaint  Patient presents with  . Neck - Routine Post Op   Visit Diagnoses:  1. Spinal stenosis of cervical region   2. Cervical spinal stenosis     Plan: Continue collar patient's upper pain medication Steri-Strips change incision looks good Return visit in 5 weeks for lateral flexion-extension x-ray cervical spine and AP x-ray cervical spine Follow-Up Instructions: Return in about 5 weeks (around 11/23/2016).   Orders:  Orders Placed This Encounter  Procedures  . XR Cervical Spine 2 or 3 views   No orders of the defined types were placed in this encounter.  Patient is status post C5-6, C6-7 ACDF on 10/13/2016. She is 6 days out. She is not having any pain.  Continued collar.  PMFS History: Patient Active Problem List   Diagnosis Date Noted  . Cervical spinal stenosis 10/13/2016   Past Medical History:  Diagnosis Date  . Anemia    years ago per pt  . Cancer [facility]) 2002   left breast  . Fatty liver     Family History  Problem Relation Age of Onset  . Hypertension Mother   . Diabetes Mother   . Hypertension Father     Past Surgical History:  Procedure Laterality Date  . ANTERIOR CERVICAL DECOMP/DISCECTOMY FUSION N/A 10/13/2016   Procedure: C5-6, C6-7 Anterior Cervical Discectomy and Fusion, Allograft, Plate;  Surgeon: [name], MD;  Location: [place];  Service: Orthopedics;  Laterality: N/A;  . BREAST SURGERY     reconstructive breast surgery on left after mastectomy  . MASTECTOMY Left 2002  . TUBAL LIGATION     Social History   Occupational History  . Not on file.   Social History Main Topics  . Smoking status: Never Smoker  . Smokeless tobacco: Never Used  . Alcohol use No  . Drug use: No  . Sexual activity: Not on file

## 2016-10-19 NOTE — Progress Notes (Signed)
Mailed to Dr. Scotty Court

## 2016-11-01 ENCOUNTER — Telehealth (INDEPENDENT_AMBULATORY_CARE_PROVIDER_SITE_OTHER): Payer: Self-pay | Admitting: *Deleted

## 2016-11-01 NOTE — Telephone Encounter (Signed)
Pt called asking if she is able to drive with neck brace on. CB: [phone]

## 2016-11-01 NOTE — Telephone Encounter (Signed)
Please advise 

## 2016-11-02 NOTE — Telephone Encounter (Signed)
Only for an emergency. [employer] may issue you a ticket for driving while impaired

## 2016-11-02 NOTE — Telephone Encounter (Signed)
I left message for patient advising. 

## 2016-11-23 ENCOUNTER — Ambulatory Visit (INDEPENDENT_AMBULATORY_CARE_PROVIDER_SITE_OTHER): Payer: Self-pay

## 2016-11-23 ENCOUNTER — Ambulatory Visit (INDEPENDENT_AMBULATORY_CARE_PROVIDER_SITE_OTHER): Payer: Managed Care, Other (non HMO) | Admitting: Orthopaedic Surgery

## 2016-11-23 ENCOUNTER — Encounter (INDEPENDENT_AMBULATORY_CARE_PROVIDER_SITE_OTHER): Payer: Self-pay | Admitting: Orthopaedic Surgery

## 2016-11-23 VITALS — BP 134/82 | HR 65 | Ht 65.0 in | Wt 215.0 lb

## 2016-11-23 DIAGNOSIS — M25511 Pain in right shoulder: Secondary | ICD-10-CM | POA: Diagnosis not present

## 2016-11-23 DIAGNOSIS — M4802 Spinal stenosis, cervical region: Secondary | ICD-10-CM

## 2016-11-23 NOTE — Progress Notes (Addendum)
   Post-Op Visit Note   Patient: Alexis Webb           Date of Birth: 08/23/1966           MRN: [id number] Visit Date: 11/23/2016 PCP: Deloria Lair, MD   Assessment & Plan:  Chief Complaint:  Chief Complaint  Patient presents with  . Neck - Routine Post Op   Visit Diagnoses:  1. Spinal stenosis of cervical region     Plan: Patient is 5 weeks post cervical fusion. Incision is well-healed good with preop pain. She's happy with the surgical result and can resume work on 12/05/2016 regular work no restrictions. Patients have problems with her right shoulder occasionally doing better since she's been out of work for her cervical fusion healing. A time she's had pain without suture using overhead activities. Some difficulty getting her arm up overhead but today's doing much better. If she has persistent problems with that she can call and make an appointment and we can recheck her.  Patient before she left decided she wanted reconsider requested an injection in her shoulder. Center prepping and subacromial injection performed with good relief. She has persistent problems she will call let us know. Follow-Up Instructions: No Follow-up on file.   Orders:  Orders Placed This Encounter  Procedures  . XR Cervical Spine 2 or 3 views   No orders of the defined types were placed in this encounter.  HPI Patient returns for follow up C5-6, C6-7 ACD&F, Allograft, and Plate on [id number]. She is 5 weeks 6 days post op. She states that she is doing ok. She does have some soreness and a little puffiness in her right shoulder. She continues to wear the collar.  PMFS History: Patient Active Problem List   Diagnosis Date Noted  . Cervical spinal stenosis 10/13/2016   Past Medical History:  Diagnosis Date  . Anemia    years ago per pt  . Cancer [facility]) 2002   left breast  . Fatty liver     Family History  Problem Relation Age of Onset  . Hypertension Mother   . Diabetes Mother   .  Hypertension Father     Past Surgical History:  Procedure Laterality Date  . ANTERIOR CERVICAL DECOMP/DISCECTOMY FUSION N/A 10/13/2016   Procedure: C5-6, C6-7 Anterior Cervical Discectomy and Fusion, Allograft, Plate;  Surgeon: [name], MD;  Location: [place];  Service: Orthopedics;  Laterality: N/A;  . BREAST SURGERY     reconstructive breast surgery on left after mastectomy  . MASTECTOMY Left 2002  . TUBAL LIGATION     Social History   Occupational History  . Not on file.   Social History Main Topics  . Smoking status: Never Smoker  . Smokeless tobacco: Never Used  . Alcohol use No  . Drug use: No  . Sexual activity: Not on file

## 2017-12-04 HISTORY — PX: BREAST LUMPECTOMY: SHX2

## 2018-04-18 LAB — HM COLONOSCOPY

## 2021-09-12 ENCOUNTER — Encounter (HOSPITAL_COMMUNITY): Payer: Self-pay

## 2021-09-12 ENCOUNTER — Inpatient Hospital Stay (HOSPITAL_COMMUNITY): Payer: 59 | Attending: Hematology | Admitting: Hematology

## 2021-09-12 ENCOUNTER — Inpatient Hospital Stay (HOSPITAL_COMMUNITY): Payer: 59

## 2021-09-12 ENCOUNTER — Other Ambulatory Visit: Payer: Self-pay

## 2021-09-12 VITALS — BP 134/82 | HR 75 | Temp 96.7°F | Resp 18 | Ht 66.0 in | Wt 238.2 lb

## 2021-09-12 DIAGNOSIS — Z8042 Family history of malignant neoplasm of prostate: Secondary | ICD-10-CM | POA: Insufficient documentation

## 2021-09-12 DIAGNOSIS — Z8051 Family history of malignant neoplasm of kidney: Secondary | ICD-10-CM | POA: Diagnosis not present

## 2021-09-12 DIAGNOSIS — Z9012 Acquired absence of left breast and nipple: Secondary | ICD-10-CM | POA: Diagnosis not present

## 2021-09-12 DIAGNOSIS — Z9011 Acquired absence of right breast and nipple: Secondary | ICD-10-CM

## 2021-09-12 DIAGNOSIS — D0511 Intraductal carcinoma in situ of right breast: Secondary | ICD-10-CM | POA: Insufficient documentation

## 2021-09-12 DIAGNOSIS — Z23 Encounter for immunization: Secondary | ICD-10-CM

## 2021-09-12 DIAGNOSIS — Z800 Family history of malignant neoplasm of digestive organs: Secondary | ICD-10-CM | POA: Diagnosis not present

## 2021-09-12 DIAGNOSIS — Z853 Personal history of malignant neoplasm of breast: Secondary | ICD-10-CM | POA: Insufficient documentation

## 2021-09-12 DIAGNOSIS — Z923 Personal history of irradiation: Secondary | ICD-10-CM | POA: Insufficient documentation

## 2021-09-12 DIAGNOSIS — Z79899 Other long term (current) drug therapy: Secondary | ICD-10-CM | POA: Diagnosis not present

## 2021-09-12 DIAGNOSIS — D0510 Intraductal carcinoma in situ of unspecified breast: Secondary | ICD-10-CM | POA: Insufficient documentation

## 2021-09-12 DIAGNOSIS — Z803 Family history of malignant neoplasm of breast: Secondary | ICD-10-CM | POA: Insufficient documentation

## 2021-09-12 DIAGNOSIS — Z9221 Personal history of antineoplastic chemotherapy: Secondary | ICD-10-CM | POA: Insufficient documentation

## 2021-09-12 LAB — COMPREHENSIVE METABOLIC PANEL
ALT: 12 U/L (ref 0–44)
AST: 22 U/L (ref 15–41)
Albumin: 4 g/dL (ref 3.5–5.0)
Alkaline Phosphatase: 137 U/L — ABNORMAL HIGH (ref 38–126)
Anion gap: 8 (ref 5–15)
BUN: 13 mg/dL (ref 6–20)
CO2: 27 mmol/L (ref 22–32)
Calcium: 9.1 mg/dL (ref 8.9–10.3)
Chloride: 105 mmol/L (ref 98–111)
Creatinine, Ser: 0.67 mg/dL (ref 0.44–1.00)
GFR, Estimated: >60 mL/min (ref >60)
Glucose, Bld: 93 mg/dL (ref 70–99)
Potassium: 3.9 mmol/L (ref 3.5–5.1)
Sodium: 140 mmol/L (ref 135–145)
Total Bilirubin: 0.6 mg/dL (ref 0.3–1.2)
Total Protein: 7.8 g/dL (ref 6.5–8.1)

## 2021-09-12 LAB — CBC WITH DIFFERENTIAL/PLATELET
Abs Immature Granulocytes: 0.01 10*3/uL (ref 0.00–0.07)
Basophils Absolute: 0 10*3/uL (ref 0.0–0.1)
Basophils Relative: 0 %
Eosinophils Absolute: 0.1 10*3/uL (ref 0.0–0.5)
Eosinophils Relative: 1 %
HCT: 37.2 % (ref 36.0–46.0)
Hemoglobin: 11.8 g/dL — ABNORMAL LOW (ref 12.0–15.0)
Immature Granulocytes: 0 %
Lymphocytes Relative: 26 %
Lymphs Abs: 1.8 10*3/uL (ref 0.7–4.0)
MCH: 29.3 pg (ref 26.0–34.0)
MCHC: 31.7 g/dL (ref 30.0–36.0)
MCV: 92.3 fL (ref 80.0–100.0)
Monocytes Absolute: 0.4 10*3/uL (ref 0.1–1.0)
Monocytes Relative: 6 %
Neutro Abs: 4.7 10*3/uL (ref 1.7–7.7)
Neutrophils Relative %: 67 %
Platelets: 214 10*3/uL (ref 150–400)
RBC: 4.03 MIL/uL (ref 3.87–5.11)
RDW: 14.6 % (ref 11.5–15.5)
WBC: 7.1 10*3/uL (ref 4.0–10.5)
nRBC: 0 % (ref 0.0–0.2)

## 2021-09-12 LAB — VITAMIN D 25 HYDROXY (VIT D DEFICIENCY, FRACTURES): Vit D, 25-Hydroxy: 32.2 ng/mL (ref 30–100)

## 2021-09-12 MED ORDER — INFLUENZA VAC SPLIT QUAD 0.5 ML IM SUSY: 0.5000 mL | PREFILLED_SYRINGE | Freq: Once | INTRAMUSCULAR | Status: DC

## 2021-09-12 NOTE — Progress Notes (Signed)
I met with the patient today during and following her initial visit with Dr. Delton Coombes. I provided my contact information and encouraged her to call with questions or concerns.

## 2021-09-12 NOTE — Patient Instructions (Addendum)
[place] at [facility] Discharge Instructions  You were seen and examined by Dr. Delton Coombes. Dr. Delton Coombes is a medical oncologist, meaning he specializes in the management of cancer diagnoses. Dr. Delton Coombes discussed your past medical history, family history of cancers, and the events that led to you being here today.  You were referred to Dr. Delton Coombes by Dr. Federico Flake to continue management of your previous breast cancer. Dr. Delton Coombes recommends routine labs today and a follow-up after your next mammogram.  Follow-up as scheduled.   Thank you for choosing [place] at [facility] to provide your oncology and hematology care.  To afford each patient quality time with our provider, please arrive at least 15 minutes before your scheduled appointment time.   If you have a lab appointment with the [facility] please come in thru the Main Entrance and check in at the main information desk.  You need to re-schedule your appointment should you arrive 10 or more minutes late.  We strive to give you quality time with our providers, and arriving late affects you and other patients whose appointments are after yours.  Also, if you no show three or more times for appointments you may be dismissed from the clinic at the providers discretion.     Again, thank you for choosing [facility].  Our hope is that these requests will decrease the amount of time that you wait before being seen by our physicians.       _____________________________________________________________  Should you have questions after your visit to [facility], please contact our office at [phone] and follow the prompts.  Our office hours are 8:00 a.m. and 4:30 p.m. Monday - Friday.  Please note that voicemails left after 4:00 p.m. may not be returned until the following business day.  We are closed weekends and major holidays.  You do have access to a  nurse 24-7, just call the main number to the clinic [phone] and do not press any options, hold on the line and a nurse will answer the phone.    For prescription refill requests, have your pharmacy contact our office and allow 72 hours.    Due to Covid, you will need to wear a mask upon entering the hospital. If you do not have a mask, a mask will be given to you at the Main Entrance upon arrival. For doctor visits, patients may have 1 support person age 69 or older with them. For treatment visits, patients can not have anyone with them due to social distancing guidelines and our immunocompromised population.

## 2021-09-12 NOTE — Progress Notes (Signed)
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 female is here because of her history of breast cancer.  Today she reports feeling good. She is currently on antiestrogen therapy with letrozole and she is tolerating it well. 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 She was diagnosed with right breast DCIS in 2019. She irregularly takes calcium and 4000 units of vitamin D daily as she reports calcium constipates her. She reports chronic pain due to arthritis in her right knee. She denies ankle swellings. She is not currently working as of 06/07/21, but she previously worked in [employer], educational. She denies smoking history. Her father had kidney cancer, and her paternal uncle had stomach cancer. Her paternal aunt had breast cancer in her 64's. Another paternal uncle has prostate cancer, and a different paternal uncle as well as her paternal grandmother had pancreatic cancer. Her paternal first cousin had breast cancer.   In terms of breast cancer risk profile:  She has family history of Breast/GYN/GI cancer  I reviewed her records extensively and collaborated the history with the patient.  SUMMARY OF ONCOLOGIC HISTORY: Oncology History  DCIS (ductal carcinoma in situ)  05/14/2018 Cancer Staging   Staging form: Breast, AJCC 8th Edition - Clinical stage from 05/14/2018: Stage 0 (cTis (DCIS), cN0, cM0, ER+, PR+, HER2-) - Signed by Derek Jack, MD on 09/12/2021 Nuclear grade: G1   09/12/2021 Initial Diagnosis   DCIS (ductal carcinoma in situ)     MEDICAL  HISTORY:  Past Medical History:  Diagnosis Date   Anemia    years ago per pt   Cancer ([place]) 2002   left breast   Fatty liver     SURGICAL HISTORY: Past Surgical History:  Procedure Laterality Date   ANTERIOR CERVICAL DECOMP/DISCECTOMY FUSION N/A 10/13/2016   Procedure: C5-6, C6-7 Anterior Cervical Discectomy and Fusion, Allograft, Plate;  Surgeon: [name], MD;  Location: [facility];  Service: Orthopedics;  Laterality: N/A;   BREAST SURGERY     reconstructive breast surgery on left after mastectomy   MASTECTOMY Left 2002   TUBAL LIGATION      SOCIAL HISTORY: Social History   Socioeconomic History   Marital status: Divorced    Spouse name: Not on file   Number of children: Not on file   Years of education: Not on file   Highest education level: Not on file  Occupational History   Not on file  Tobacco Use   Smoking status: Never   Smokeless tobacco: Never  Substance and Sexual Activity   Alcohol use: No   Drug use: No   Sexual activity: Not on file  Other Topics Concern   Not on file  Social History Narrative   Not on file   Social Determinants of Health   Financial Resource Strain: Not on file  Food Insecurity: Not on file  Transportation Needs: Not on file  Physical Activity: Not on file  Stress: Not on file  Social Connections: Not on file  Intimate Partner Violence: Not on file    FAMILY HISTORY: Family History  Problem Relation Age of Onset   Hypertension  Mother    Diabetes Mother    Hypertension Father     ALLERGIES:  has No Known Allergies.  MEDICATIONS:  Current Outpatient Medications  Medication Sig Dispense Refill   acetaminophen (TYLENOL) 500 MG tablet Take 500 mg by mouth every 6 (six) hours as needed.     Cholecalciferol 50 MCG (2000 UT) TABS Take 1 tablet by mouth every evening.     gabapentin (NEURONTIN) 300 MG capsule Take by mouth.     letrozole (FEMARA) 2.5 MG tablet Take by mouth.     oxyCODONE-acetaminophen (PERCOCET/ROXICET)  5-325 MG tablet Take 1-2 tablets by mouth every 6 (six) hours as needed for moderate pain. 40 tablet 0   Current Facility-Administered Medications  Medication Dose Route Frequency Provider Last Rate Last Admin   influenza vac split quadrivalent PF (FLUARIX) injection 0.5 mL  0.5 mL Intramuscular Once Derek Jack, MD        REVIEW OF SYSTEMS:   Review of Systems  Constitutional:  Negative for appetite change and fatigue (75%).  Cardiovascular:  Negative for leg swelling.  Musculoskeletal:  Positive for arthralgias (6/10 back and R knee).  Neurological:  Positive for dizziness.  All other systems reviewed and are negative.  PHYSICAL EXAMINATION: ECOG PERFORMANCE STATUS: 0 - Asymptomatic  Vitals:   09/12/21 1319  BP: 134/82  Pulse: 75  Resp: 18  Temp: (!) 96.7 F (35.9 C)  SpO2: 100%   Filed Weights   09/12/21 1319  Weight: 238 lb 3.2 oz (108 kg)   Physical Exam Vitals reviewed.  Constitutional:      Appearance: Normal appearance.  Cardiovascular:     Rate and Rhythm: Normal rate and regular rhythm.     Pulses: Normal pulses.     Heart sounds: Normal heart sounds.  Pulmonary:     Effort: Pulmonary effort is normal.     Breath sounds: Normal breath sounds.  Chest:  Breasts:    Right: Normal. No mass, skin change (lumpectomy scar around nipple in UOQ wiithin normal limits) or tenderness.     Left: Absent. No mass or skin change (mastectomy site and implant normal).  Musculoskeletal:     Right lower leg: No edema.     Left lower leg: No edema.  Neurological:     General: No focal deficit present.     Mental Status: She is alert and oriented to person, place, and time.  Psychiatric:        Mood and Affect: Mood normal.        Behavior: Behavior normal.    Breast Exam Chaperone: Thana Ates    LABORATORY DATA:  I have reviewed the data as listed No results found for this or any previous visit (from the past 2160 hour(s)).  RADIOGRAPHIC STUDIES: I  have personally reviewed the radiological reports and agreed with the findings in the report. No results found.   ASSESSMENT:  Right breast DCIS: - Right breast lumpectomy and SLNB on 05/14/2018 - Pathology shows DCIS.  No evidence of invasive carcinoma.  Margins negative.  0/3 lymph nodes involved.  ER 100%/PR 100%.  HER2 by FISH negative. - She was evaluated by Dr. Conley Simmonds at [facility].  Patient was referred to [place] as her insurance is no longer accepted there. - Femara 2.5 mg daily started on 07/09/2018. - XRT to the right breast from 07/15/2018 through 08/06/2018.  2.  Left breast invasive carcinoma: - Reportedly diagnosed in 2002.  I do not have records to review. - He underwent  a lumpectomy followed by mastectomy, followed by implant.  She received AC followed by T chemotherapy.  This was followed by radiation therapy.  No adjuvant antiestrogen therapy was given.  3. 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  She is non-smoker. - Father had kidney cancer.  Paternal uncle had "stomach cancer".  Paternal aunt had breast cancer in her 30s.  Another paternal uncle had prostate cancer.  Another paternal uncle had pancreatic cancer.  Paternal grandmother also had pancreatic cancer.  Paternal first cousin had breast cancer.   PLAN:  Right breast DCIS: - Physical examination today shows right breast lumpectomy scar around the areola is within normal limits with no palpable masses in right breast.  Diffuse tenderness present.  She reports that she had tenderness since she had surgery and radiation. - She is tolerating letrozole very well. - Last mammogram on 04/20/2021 was BI-RADS Category 2. - We will arrange for another mammogram in May of next year.  CBC from today reviewed by me was within normal limits. - RTC 6 months for follow-up with repeat labs.  2.  Bone health: - Bone density test was done on 09/16/2020.  I could not review the report. - She  is taking vitamin D 4000 units daily and Citracal once daily. - We will check vitamin D level at next visit.  3.  High risk family and personal history: - Verdis Frederickson testing was done on 04/23/2018 at [facility]. - As per the previous notes, she had VUS involving the APC and AXIN2.  No pathogenic mutation identified per note.  We will obtain copy of the results.    Derek Jack, MD 09/12/21 1:57 PM  [place] [phone]   I, Thana Ates, am acting as a scribe for Dr. Derek Jack.  I, Derek Jack MD, have reviewed the above documentation for accuracy and completeness, and I agree with the above.

## 2021-09-19 ENCOUNTER — Ambulatory Visit (HOSPITAL_COMMUNITY): Payer: Managed Care, Other (non HMO) | Admitting: Hematology

## 2021-10-20 ENCOUNTER — Ambulatory Visit: Payer: 59 | Admitting: Orthopaedic Surgery

## 2021-10-20 ENCOUNTER — Other Ambulatory Visit: Payer: Self-pay

## 2021-10-27 ENCOUNTER — Encounter (HOSPITAL_COMMUNITY): Payer: Self-pay

## 2021-10-27 ENCOUNTER — Other Ambulatory Visit: Payer: Self-pay

## 2021-10-27 ENCOUNTER — Emergency Department (HOSPITAL_COMMUNITY)
Admission: EM | Admit: 2021-10-27 | Discharge: 2021-10-27 | Disposition: A | Discharge status: Home / Self Care | Payer: 59 | Attending: Emergency Medicine | Admitting: Emergency Medicine

## 2021-10-27 ENCOUNTER — Emergency Department (HOSPITAL_COMMUNITY): Payer: 59

## 2021-10-27 DIAGNOSIS — M545 Low back pain, unspecified: Secondary | ICD-10-CM | POA: Diagnosis not present

## 2021-10-27 DIAGNOSIS — Z853 Personal history of malignant neoplasm of breast: Secondary | ICD-10-CM | POA: Diagnosis not present

## 2021-10-27 MED ORDER — HYDROCODONE-ACETAMINOPHEN 5-325 MG PO TABS
1.0000 | ORAL_TABLET | Freq: Once | ORAL | Status: AC
Start: 2021-10-27 — End: 2021-10-27
  Administered 2021-10-27: 1 via ORAL
  Filled 2021-10-27: qty 1

## 2021-10-27 MED ORDER — MELOXICAM 7.5 MG PO TABS: 7.5000 mg | ORAL_TABLET | Freq: Every day | ORAL | 0 refills | Status: AC

## 2021-10-27 MED ORDER — KETOROLAC TROMETHAMINE 60 MG/2ML IM SOLN
30.0000 mg | Freq: Once | INTRAMUSCULAR | Status: AC
  Administered 2021-10-27: 30 mg via INTRAMUSCULAR
  Filled 2021-10-27: qty 2

## 2021-10-27 NOTE — Discharge Instructions (Addendum)
Take the anti inflammatory pain medicine prescribed.  Continue using your home hydrocodone as needed.  Avoid lifting,  Bending,  Twisting or any other activity that worsens your pain over the next week.  Apply a heating pad to your lower back and hip region for 20 minutes 3 times daily. You should get rechecked if your symptoms are not better over the next 5 days,  Or you develop increased pain,  Weakness in your leg(s) or loss of bladder or bowel function - these are symptoms of a worsening condition.  Your xray shows no new findings, but some chronic arthritis findings.

## 2021-10-27 NOTE — ED Notes (Signed)
Patient to xray at this time

## 2021-10-27 NOTE — ED Provider Notes (Signed)
[facility] EMERGENCY DEPARTMENT Provider Note   CSN: [id number] Arrival date & time: 10/27/21  0932     History Chief Complaint  Patient presents with   Back Pain    Alexis Webb is a 55 y.o. female with a distant history of breast cancer, history of cervical spinal stenosis and lumbar degenerative changes presenting for evaluation of low midline lumbar pain which radiates into her left hip region which started 4 days ago.  She denies injury or overuse.  She simply woke with this pain 4 days ago.  Is described as sharp and constant but worse with movement, better at rest.  She currently takes gabapentin for chronic pain in her cervical and lumbar spine.  She denies fevers or chills, weakness or numbness in her legs and has had no urinary or fecal incontinence or retention.  She has tried no other medications for pain relief.  The history is provided by the patient.      Past Medical History:  Diagnosis Date   Anemia    years ago per pt   Cancer [facility]) 2002   left breast   Fatty liver     Patient Active Problem List   Diagnosis Date Noted   DCIS (ductal carcinoma in situ) 09/12/2021   Cervical spinal stenosis 10/13/2016    Past Surgical History:  Procedure Laterality Date   ANTERIOR CERVICAL DECOMP/DISCECTOMY FUSION N/A 10/13/2016   Procedure: C5-6, C6-7 Anterior Cervical Discectomy and Fusion, Allograft, Plate;  Surgeon: [name], MD;  Location: [place];  Service: Orthopedics;  Laterality: N/A;   BREAST SURGERY     reconstructive breast surgery on left after mastectomy   MASTECTOMY Left 2002   TUBAL LIGATION       OB History   No obstetric history on file.     Family History  Problem Relation Age of Onset   Hypertension Mother    Diabetes Mother    Hypertension Father     Social History   Tobacco Use   Smoking status: Never   Smokeless tobacco: Never  Substance Use Topics   Alcohol use: No   Drug use: No    Home Medications Prior to Admission  medications   Medication Sig Start Date End Date Taking? Authorizing Provider  meloxicam (MOBIC) 7.5 MG tablet Take 1-2 tablets (7.5-15 mg total) by mouth daily. 10/27/21  Yes Rupal Childress, Almyra Free, PA-C  acetaminophen (TYLENOL) 500 MG tablet Take 500 mg by mouth every 6 (six) hours as needed.    [provider]  Cholecalciferol 50 MCG (2000 UT) TABS Take 1 tablet by mouth every evening.    [provider]  gabapentin (NEURONTIN) 300 MG capsule Take by mouth. 08/21/20   [provider]  letrozole (FEMARA) 2.5 MG tablet Take by mouth. 11/10/20   [provider]  oxyCODONE-acetaminophen (PERCOCET/ROXICET) 5-325 MG tablet Take 1-2 tablets by mouth every 6 (six) hours as needed for moderate pain. 10/14/16   [name], MD    Allergies    Patient has no known allergies.  Review of Systems   Review of Systems  Constitutional:  Negative for fever.  Respiratory:  Negative for shortness of breath.   Cardiovascular:  Negative for chest pain and leg swelling.  Gastrointestinal:  Negative for abdominal distention, abdominal pain and constipation.  Genitourinary:  Negative for difficulty urinating, dysuria, flank pain, frequency and urgency.  Musculoskeletal:  Positive for back pain. Negative for gait problem and joint swelling.  Skin:  Negative for  rash.  Neurological:  Negative for weakness and numbness.   Physical Exam Updated Vital Signs BP 136/85 (BP Location: Left Arm)   Pulse 89   Temp 97.6 F (36.4 C) (Oral)   Resp 18   Ht 5\' 6"  (1.676 m)   Wt 108.4 kg   SpO2 100%   BMI 38.58 kg/m   Physical Exam Vitals and nursing note reviewed.  Constitutional:      Appearance: She is well-developed.  HENT:     Head: Normocephalic.  Eyes:     Conjunctiva/sclera: Conjunctivae normal.  Cardiovascular:     Rate and Rhythm: Normal rate.     Comments: Pedal pulses normal. Pulmonary:     Effort: Pulmonary effort is normal.  Abdominal:     General: Bowel sounds are  normal. There is no distension.     Palpations: Abdomen is soft. There is no mass.  Musculoskeletal:        General: Normal range of motion.     Cervical back: Normal range of motion and neck supple.     Lumbar back: Tenderness present. No swelling, edema or spasms.  Skin:    General: Skin is warm and dry.  Neurological:     Mental Status: She is alert.     Sensory: No sensory deficit.     Motor: No tremor or atrophy.     Gait: Gait normal.     Deep Tendon Reflexes:     Reflex Scores:      Patellar reflexes are 2+ on the right side and 2+ on the left side.    Comments: No strength deficit noted in hip and knee flexor and extensor muscle groups.  Ankle flexion and extension intact.    ED Results / Procedures / Treatments   Labs (all labs ordered are listed, but only abnormal results are displayed) Labs Reviewed - No data to display  EKG None  Radiology DG Lumbar Spine Complete  Result Date: 10/27/2021 CLINICAL DATA:  Severe back pain. History of breast cancer. No known injury. EXAM: LUMBAR SPINE - COMPLETE 4+ VIEW COMPARISON:  08/17/2020 FINDINGS: Five lumbar type vertebral segments. Vertebral body heights and alignment are maintained. No lytic or sclerotic bone lesion is identified. No fracture identified. Intervertebral disc spaces are relatively preserved. Minimal degenerative endplate changes. Mild lower lumbar facet arthrosis. IMPRESSION: Mild degenerative changes of the lumbar spine. No acute findings. Electronically Signed   By: Davina Poke D.O.   On: 10/27/2021 11:07    Procedures Procedures   Medications Ordered in ED Medications  ketorolac (TORADOL) injection 30 mg (30 mg Intramuscular Given 10/27/21 1025)  HYDROcodone-acetaminophen (NORCO/VICODIN) 5-325 MG per tablet 1 tablet (1 tablet Oral Given 10/27/21 1024)    ED Course  I have reviewed the triage vital signs and the nursing notes.  Pertinent labs & imaging results that were available during my care of  the patient were reviewed by me and considered in my medical decision making (see chart for details).    MDM Rules/Calculators/A&P                           No neuro deficit on exam or by history to suggest emergent or surgical presentation.  Also discussed worsened sx that should prompt immediate re-evaluation including distal weakness, bowel/bladder retention/incontinence.      Final Clinical Impression(s) / ED Diagnoses Final diagnoses:  Acute left-sided low back pain without sciatica    Rx / DC Orders ED  Discharge Orders          Ordered    meloxicam (MOBIC) 7.5 MG tablet  Daily        10/27/21 1126             Evalee Jefferson, Hershal Coria 10/27/21 1127    Daleen Bo, MD 10/29/21 [phone]

## 2021-10-27 NOTE — ED Triage Notes (Signed)
Patient complaining of back pain x4 days. Denies any injury. Pain radiates to left hip.

## 2021-11-03 ENCOUNTER — Ambulatory Visit: Payer: Self-pay

## 2021-11-03 ENCOUNTER — Ambulatory Visit (INDEPENDENT_AMBULATORY_CARE_PROVIDER_SITE_OTHER): Payer: 59 | Admitting: Orthopaedic Surgery

## 2021-11-03 ENCOUNTER — Encounter: Payer: Self-pay | Admitting: Orthopaedic Surgery

## 2021-11-03 ENCOUNTER — Other Ambulatory Visit: Payer: Self-pay

## 2021-11-03 ENCOUNTER — Ambulatory Visit (INDEPENDENT_AMBULATORY_CARE_PROVIDER_SITE_OTHER): Payer: 59

## 2021-11-03 VITALS — Ht 65.0 in | Wt 239.0 lb

## 2021-11-03 DIAGNOSIS — G8929 Other chronic pain: Secondary | ICD-10-CM | POA: Diagnosis not present

## 2021-11-03 DIAGNOSIS — M25561 Pain in right knee: Secondary | ICD-10-CM

## 2021-11-03 DIAGNOSIS — M170 Bilateral primary osteoarthritis of knee: Secondary | ICD-10-CM

## 2021-11-03 DIAGNOSIS — M25562 Pain in left knee: Secondary | ICD-10-CM

## 2021-11-03 NOTE — Progress Notes (Signed)
Office Visit Note   Patient: Alexis Webb           Date of Birth: Aug 30, 1966           MRN: [id number] Visit Date: 11/03/2021              Requested by: Deloria Lair., MD [place],  [place] [address] PCP: [facility]   Assessment & Plan: Visit Diagnoses:  1. Bilateral chronic knee pain   2. Bilateral primary osteoarthritis of knee     Plan: Patient states she needs to get back working.  We discussed jobs where she is doing some sitting some standing not standing on her feet all day long and also jobs where she would avoid climbing stairs kneeling or stooping due to her knee osteoarthritis.  Right knee worse arthritis and more symptomatic than left knee.  Her gait may be aggravating some of her lumbar symptoms.  Follow-Up Instructions: No follow-ups on file.   Orders:  Orders Placed This Encounter  Procedures   XR KNEE 3 VIEW LEFT   XR KNEE 3 VIEW RIGHT   No orders of the defined types were placed in this encounter.     Procedures: No procedures performed   Clinical Data: No additional findings.   Subjective: Chief Complaint  Patient presents with   Lower Back - Pain   Left Knee - Pain   Right Knee - Pain    HPI 55 year old female with low back pain that radiates into both hips right and left.  Patient been seen in pain management was started on gabapentin post hydrocodone and meloxicam.  She has bilateral knee pain worse on the right knee than left knee.  Patient states she is getting ready to switch insurance at the beginning of the year.  Previous arthroscopic surgery right knee without relief.  She has pain both medial and lateral crepitus with knee range of motion worse on the right than left knee.  Knee sometimes gives out.  She had a job where she worked at [employer] and was on her feet working the [employer].  She lost his job due to being on extended medical leave.  PDMP 221, 100, 0 total risk score 110.  Patient  denies myelopathic symptoms.  Lumbar MRI scan showed mild multilevel spondylosis mild foraminal narrowing L3, bilateral L4.  Central disc protrusion L5-S1 without significant compression.  MRI date 10/06/2020.  Patient seen with 40 pages of notes this outlined previous injections in her knee conservative treatment, anti-inflammatories.  Cortisone injection helped the left knee but was ineffective in the right knee.    Review of Systems positive history of breast cancer 2019.  History of cervical stenosis.  Symptoms resolved with two-level cervical fusion we did 2017.  All other systems noncontributory to HPI.   Objective: Vital Signs: Ht 5\' 5"  (1.651 m)   Wt 239 lb (108.4 kg)   BMI 39.77 kg/m   Physical Exam Constitutional:      Appearance: She is well-developed.  HENT:     Head: Normocephalic.     Right Ear: External ear normal.     Left Ear: External ear normal. There is no impacted cerumen.  Eyes:     Pupils: Pupils are equal, round, and reactive to light.  Neck:     Thyroid: No thyromegaly.     Trachea: No tracheal deviation.  Cardiovascular:     Rate and Rhythm: Normal rate.  Pulmonary:  Effort: Pulmonary effort is normal.  Abdominal:     Palpations: Abdomen is soft.  Musculoskeletal:     Cervical back: No rigidity.  Skin:    General: Skin is warm and dry.  Neurological:     Mental Status: She is alert and oriented to person, place, and time.  Psychiatric:        Behavior: Behavior normal.    Ortho Exam patient slow with ambulation ambulates with the right more than left knee gait.  Negative logroll the hips knee and ankle jerk are intact mild sciatic notch tenderness she has some tenderness over the sacroiliac joints negative FABER test right and left.  Some tenderness just below the iliac crest posteriorly.  She has pain with trying to reach full extension with knee pain.  Crepitus with knee range of motion right worse than left.  Specialty Comments:  No specialty  comments available.  Imaging: XR KNEE 3 VIEW LEFT  Result Date: 11/03/2021 Standing AP both knees lateral left knee; x-ray demonstrates bilateral knee osteoarthritis moderate degree worse in the medial and lateral compartment with joint space narrowing marginal osteophyte subchondral sclerosis.  Tricompartmental changes noted. Impression: Moderate left knee osteoarthritis.  XR KNEE 3 VIEW RIGHT  Result Date: 11/03/2021 Standing AP both knees lateral right knee sunrise patella x-ray right knee demonstrates tricompartmental degenerative arthritis worse in the medial and patellofemoral compartment. Impression: Moderate right knee osteoarthritis.    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   Cervical spinal stenosis 10/13/2016   Past Medical History:  Diagnosis Date   Anemia    years ago per pt   Cancer ([place]) 2002   left breast   Fatty liver     Family History  Problem Relation Age of Onset   Hypertension Mother    Diabetes Mother    Hypertension Father     Past Surgical History:  Procedure Laterality Date   ANTERIOR CERVICAL DECOMP/DISCECTOMY FUSION N/A 10/13/2016   Procedure: C5-6, C6-7 Anterior Cervical Discectomy and Fusion, Allograft, Plate;  Surgeon: [name], MD;  Location: [facility];  Service: Orthopedics;  Laterality: N/A;   BREAST SURGERY     reconstructive breast surgery on left after mastectomy   MASTECTOMY Left 2002   TUBAL LIGATION     Social History   Occupational History   Not on file  Tobacco Use   Smoking status: Never   Smokeless tobacco: Never  Substance and Sexual Activity   Alcohol use: No   Drug use: No   Sexual activity: Not on file

## 2021-11-21 ENCOUNTER — Ambulatory Visit (HOSPITAL_COMMUNITY): Payer: 59 | Attending: Physician Assistant

## 2021-11-21 ENCOUNTER — Other Ambulatory Visit: Payer: Self-pay

## 2021-11-21 DIAGNOSIS — M6281 Muscle weakness (generalized): Secondary | ICD-10-CM

## 2021-11-21 DIAGNOSIS — G8929 Other chronic pain: Secondary | ICD-10-CM | POA: Insufficient documentation

## 2021-11-21 DIAGNOSIS — R262 Difficulty in walking, not elsewhere classified: Secondary | ICD-10-CM | POA: Diagnosis present

## 2021-11-21 DIAGNOSIS — M545 Low back pain, unspecified: Secondary | ICD-10-CM

## 2021-11-21 NOTE — Therapy (Signed)
[place] [facility], [place], [address] Phone: [phone]   Fax:  [phone]  Physical Therapy Evaluation  Patient Details  Name: Alexis Webb MRN: [id number] Date of Birth: 1966/09/22 Referring Provider (PT): Alexis Webb, Alexis [facility]   Encounter Date: 11/21/2021   PT End of Session - 11/21/21 1459     Visit Number 1    Number of Visits 8    Date for PT Re-Evaluation 01/02/22    Authorization Type [employer], no auth, 30 VL    PT Start Time 1505    PT Stop Time 1550    PT Time Calculation (min) 45 min    Activity Tolerance Patient tolerated treatment well    Behavior During Therapy [facility] for tasks assessed/performed             Past Medical History:  Diagnosis Date   Anemia    years ago per pt   Cancer ([place]) 2002   left breast   Fatty liver     Past Surgical History:  Procedure Laterality Date   ANTERIOR CERVICAL DECOMP/DISCECTOMY FUSION N/A 10/13/2016   Procedure: C5-6, C6-7 Anterior Cervical Discectomy and Fusion, Allograft, Plate;  Surgeon: Alexis Killings, MD;  Location: [facility];  Service: Orthopedics;  Laterality: N/A;   BREAST SURGERY     reconstructive breast surgery on left after mastectomy   MASTECTOMY Left 2002   TUBAL LIGATION      There were no vitals filed for this visit.    Subjective Assessment - 11/21/21 1503     Subjective Pt reports multiple orthopedic issues and has been having back pain since last year and notes new onset of left low back pain and radiating left buttocks pain. Pt notes pain in left buttocks and burning on anterior/superior left thigh. No mechanism of injury identified with insdious onset    How long can you sit comfortably? "not very long"    How long can you stand comfortably? "not very long"    How long can you walk comfortably? "walking around stores for about an hour"    Currently in Pain? Yes    Pain Score 4     Pain Location Buttocks    Pain Orientation Left     Pain Descriptors / Indicators Sharp    Pain Type Acute pain;Chronic pain    Pain Radiating Towards left buttocks and anterior thigh    Pain Onset More than a month ago    Pain Frequency Intermittent    Aggravating Factors  walk, standing,    Pain Relieving Factors sitting up in bed in long sitting position                Schuyler Webb PT Assessment - 11/21/21 0001       Assessment   Medical Diagnosis left hip pain; chronic joint pain    Referring Provider (PT) Alexis Webb, [facility]      Balance Screen   Has the patient fallen in the past 6 months No    Has the patient had a decrease in activity level because of a fear of falling?  No    Is the patient reluctant to leave their home because of a fear of falling?  No      Home Environment   Living Environment Private residence    Living Arrangements Alone      Prior Function   Level of Independence Independent with community mobility with device    Vocation Unemployed  Observation/Other Assessments   Focus on Therapeutic Outcomes (FOTO)  62.4% function      Posture/Postural Control   Posture/Postural Control Postural limitations    Postural Limitations Anterior pelvic tilt      ROM / Strength   AROM / PROM / Strength AROM;Strength      AROM   AROM Assessment Site Hip;Lumbar    Right/Left Hip Left    Left Hip Extension 15    Left Hip Flexion 115    Lumbar Flexion WNL    Lumbar Extension WNL/excessive    Lumbar - Right Side Bend WNL    Lumbar - Left Side Bend WNL    Lumbar - Right Rotation WNL    Lumbar - Left Rotation WNL      Strength   Strength Assessment Site Lumbar    Lumbar Flexion 2/5      Palpation   Palpation comment no spasm detected      Ambulation/Gait   Ambulation/Gait Yes    Ambulation/Gait Assistance 6: Modified independent (Device/Increase time)    Ambulation Distance (Feet) 210 Feet    Assistive device Straight cane    Gait Pattern Step-through pattern    Ambulation Surface Level;Indoor     Gait Comments 2MWT                        Objective measurements completed on examination: See above findings.       [facility] Adult PT Treatment/Exercise - 11/21/21 0001       Exercises   Exercises Lumbar      Lumbar Exercises: Stretches   Single Knee to Chest Stretch 1 rep;60 seconds      Lumbar Exercises: Supine   Ab Set 10 reps;3 seconds                       PT Short Term Goals - 11/21/21 1612       PT SHORT TERM GOAL #1   Title Patient will be independent with HEP in order to improve functional outcomes.    Time 2    Period Weeks    Status New    Target Date 12/05/21      PT SHORT TERM GOAL #2   Title Patient will report at least 25% improvement in symptoms for improved quality of life.    Baseline 4/10 pain at evaluation, reports 8/10 left buttocks pain with cooking meals    Time 2    Period Weeks    Status New    Target Date 12/05/21      PT SHORT TERM GOAL #3   Title Demo improved ambulation tolerance/speed as evidenced by 275 ft during 2MWT    Baseline 210 ft with cane    Time 2    Period Weeks    Status New    Target Date 12/05/21               PT Long Term Goals - 11/21/21 1613       PT LONG TERM GOAL #1   Title Patient will improve FOTO score by at least 5 points in order to indicate improved tolerance to activity.    Baseline 62% function    Time 4    Period Weeks    Status New    Target Date 01/02/22      PT LONG TERM GOAL #2   Title Improve trunk stablization to reduce pain by improving trunk flexion strength to 3+/5  Baseline 2/5    Time 4    Period Weeks    Status New    Target Date 01/02/22                    Plan - 11/21/21 1608     Clinical Impression Statement Patient is a 55 yo lady presenting to physical therapy with c/o low back and left hip/buttock pain. She presents with pain limited deficits in core strength, endurance, postural impairments, spinal mobility and functional  mobility with ADL. She is having to modify and restrict ADL as indicated by FOTO score as well as subjective information and objective measures which is affecting overall participation. Patient will benefit from skilled physical therapy in order to improve function and reduce impairment.    Personal Factors and Comorbidities Comorbidity 1;Time since onset of injury/illness/exacerbation    Comorbidities PMH    Examination-Activity Limitations Carry;Lift;Squat;Locomotion Level;Sleep    Examination-Participation Restrictions Cleaning;Community Activity;Yard Work;Occupation    Stability/Clinical Decision Making Stable/Uncomplicated    Clinical Decision Making Moderate    Rehab Potential Good    PT Frequency 2x / week    PT Duration 4 weeks    PT Treatment/Interventions ADLs/Self Care Home Management;Gait training;Stair training;Functional mobility training;Therapeutic activities;Therapeutic exercise;Balance training;Patient/family education;Neuromuscular re-education;Manual techniques;Dry needling;Spinal Manipulations;Joint Manipulations    PT Next Visit Plan assess for flexion bias? If better continue with flexion-based training/strengthening, if not, trial extension    PT Home Exercise Plan ab set, [facility]    Consulted and Agree with Plan of Care Patient             Patient will benefit from skilled therapeutic intervention in order to improve the following deficits and impairments:  Decreased activity tolerance, Decreased strength, Hypermobility, Difficulty walking, Postural dysfunction, Improper body mechanics, Pain, Obesity  Visit Diagnosis: Muscle weakness (generalized)  Chronic low back pain, unspecified back pain laterality, unspecified whether sciatica present  Difficulty in walking, not elsewhere classified     Problem List Patient Active Problem List   Diagnosis Date Noted   Bilateral primary osteoarthritis of knee 11/03/2021   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 [place], [place], [address] Phone: [phone]   Fax:  [phone]  Name: Alexis Webb MRN: [id number] Date of Birth: May 15, 1966

## 2021-12-02 ENCOUNTER — Other Ambulatory Visit: Payer: Self-pay

## 2021-12-02 ENCOUNTER — Ambulatory Visit (HOSPITAL_COMMUNITY): Payer: 59 | Admitting: Physical Therapy

## 2021-12-02 DIAGNOSIS — R262 Difficulty in walking, not elsewhere classified: Secondary | ICD-10-CM

## 2021-12-02 DIAGNOSIS — M545 Low back pain, unspecified: Secondary | ICD-10-CM

## 2021-12-02 DIAGNOSIS — M6281 Muscle weakness (generalized): Secondary | ICD-10-CM | POA: Diagnosis not present

## 2021-12-02 NOTE — Therapy (Signed)
[place], [place], [address] Phone: [phone]   Fax:  [phone]  Physical Therapy Treatment  Patient Details  Name: Alexis Webb MRN: [id number] Date of Birth: Aug 23, 1966 Referring Provider (PT): Launa Grill, [facility]   Encounter Date: 12/02/2021   PT End of Session - 12/02/21 1136     Visit Number 2    Number of Visits 8    Date for PT Re-Evaluation 01/02/22    Authorization Type [employer], no auth, 30 VL    PT Start Time 1130    PT Stop Time 1210    PT Time Calculation (min) 40 min    Activity Tolerance Patient tolerated treatment well    Behavior During Therapy [facility] for tasks assessed/performed             Past Medical History:  Diagnosis Date   Anemia    years ago per pt   Cancer ([place]) 2002   left breast   Fatty liver     Past Surgical History:  Procedure Laterality Date   ANTERIOR CERVICAL DECOMP/DISCECTOMY FUSION N/A 10/13/2016   Procedure: C5-6, C6-7 Anterior Cervical Discectomy and Fusion, Allograft, Plate;  Surgeon: [name], MD;  Location: [facility];  Service: Orthopedics;  Laterality: N/A;   BREAST SURGERY     reconstructive breast surgery on left after mastectomy   MASTECTOMY Left 2002   TUBAL LIGATION      There were no vitals filed for this visit.   Subjective Assessment - 12/02/21 1129     Subjective PT states that she has been doing the exercises but has not seen much difference.    How long can you sit comfortably? "not very long"    How long can you stand comfortably? "not very long"    How long can you walk comfortably? "walking around stores for about an hour"    Currently in Pain? Yes    Pain Score 4     Pain Location Back    Pain Orientation Left    Pain Descriptors / Indicators Aching    Pain Type Acute pain    Pain Radiating Towards to Lt hip    Pain Onset More than a month ago    Pain Frequency Intermittent    Aggravating Factors  walk                                [facility] Adult PT Treatment/Exercise - 12/02/21 0001       Exercises   Exercises Lumbar      Lumbar Exercises: Stretches   Single Knee to Chest Stretch 3 reps;30 seconds    Double Knee to Chest Stretch 5 reps;20 seconds   x2 then after extension as this increased pt pain   Prone on Elbows Stretch 2 reps;60 seconds    Press Ups 5 reps      Lumbar Exercises: Supine   Ab Set 10 reps;3 seconds    Pelvic Tilt 10 reps    Clam 10 reps    Bent Knee Raise 10 reps    Bridge 10 reps                     PT Education - 12/02/21 1135     Education Details Updated HEP ; educated on proper bed mobility    Person(s) Educated Patient    Methods Explanation;Handout    Comprehension  Verbalized understanding;Verbal cues required              PT Short Term Goals - 12/02/21 1138       PT SHORT TERM GOAL #1   Title Patient will be independent with HEP in order to improve functional outcomes.    Time 2    Period Weeks    Status On-going    Target Date 12/05/21      PT SHORT TERM GOAL #2   Title Patient will report at least 25% improvement in symptoms for improved quality of life.    Baseline 4/10 pain at evaluation, reports 8/10 left buttocks pain with cooking meals    Time 2    Period Weeks    Status Not Met    Target Date 12/05/21      PT SHORT TERM GOAL #3   Title Demo improved ambulation tolerance/speed as evidenced by 275 ft during 2MWT    Baseline 210 ft with cane    Time 2    Period Weeks    Status On-going    Target Date 12/05/21               PT Long Term Goals - 12/02/21 1139       PT LONG TERM GOAL #1   Title Patient will improve FOTO score by at least 5 points in order to indicate improved tolerance to activity.    Baseline 62% function    Time 4    Period Weeks    Status On-going    Target Date 01/02/22      PT LONG TERM GOAL #2   Title Improve trunk stablization to reduce pain by improving trunk  flexion strength to 3+/5    Baseline 2/5    Time 4    Period Weeks    Status On-going    Target Date 01/02/22                   Plan - 12/02/21 1136     Clinical Impression Statement Reveiwed goals with pt.  Therapist  Assessed repeated non wt bearing flexion vs. repeated non wt bearing extension to see if pt had a bias.  PT with noted flexion biased.    Personal Factors and Comorbidities Comorbidity 1;Time since onset of injury/illness/exacerbation    Comorbidities PMH    Examination-Activity Limitations Carry;Lift;Squat;Locomotion Level;Sleep    Examination-Participation Restrictions Cleaning;Community Activity;Yard Work;Occupation    Stability/Clinical Decision Making Stable/Uncomplicated    Rehab Potential Good    PT Frequency 2x / week    PT Duration 4 weeks    PT Treatment/Interventions ADLs/Self Care Home Management;Gait training;Stair training;Functional mobility training;Therapeutic activities;Therapeutic exercise;Balance training;Patient/family education;Neuromuscular re-education;Manual techniques;Dry needling;Spinal Manipulations;Joint Manipulations    PT Next Visit Plan stabilization as well as stretching    PT Home Exercise Plan ab set, [facility]; 12/30: double knee to chest, bridge, pelvic tilt    Consulted and Agree with Plan of Care Patient             Patient will benefit from skilled therapeutic intervention in order to improve the following deficits and impairments:  Decreased activity tolerance, Decreased strength, Hypermobility, Difficulty walking, Postural dysfunction, Improper body mechanics, Pain, Obesity  Visit Diagnosis: Muscle weakness (generalized)  Chronic low back pain, unspecified back pain laterality, unspecified whether sciatica present  Difficulty in walking, not elsewhere classified     Problem List Patient Active Problem List   Diagnosis Date Noted   Bilateral primary osteoarthritis of knee 11/03/2021  DCIS (ductal carcinoma in  situ) 09/12/2021   Cervical spinal stenosis 10/13/2016   Rayetta Humphrey, PT CLT [phone]  12/02/2021, 12:08 PM  [place], [place], [address] Phone: [phone]   Fax:  [phone]  Name: Alexis Webb MRN: [id number] Date of Birth: [birth year]

## 2021-12-06 ENCOUNTER — Other Ambulatory Visit: Payer: Self-pay

## 2021-12-06 ENCOUNTER — Ambulatory Visit (HOSPITAL_COMMUNITY): Payer: 59 | Attending: Physician Assistant

## 2021-12-06 DIAGNOSIS — G8929 Other chronic pain: Secondary | ICD-10-CM | POA: Insufficient documentation

## 2021-12-06 DIAGNOSIS — M6281 Muscle weakness (generalized): Secondary | ICD-10-CM | POA: Diagnosis not present

## 2021-12-06 DIAGNOSIS — R262 Difficulty in walking, not elsewhere classified: Secondary | ICD-10-CM | POA: Diagnosis not present

## 2021-12-06 DIAGNOSIS — M545 Low back pain, unspecified: Secondary | ICD-10-CM | POA: Diagnosis not present

## 2021-12-06 NOTE — Therapy (Signed)
[place], [place], [address] Phone: [phone]   Fax:  [phone]  Physical Therapy Treatment  Patient Details  Name: Alexis Webb MRN: [id number] Date of Birth: [birth year] Referring Provider (PT): Launa Grill, [facility]   Encounter Date: 12/06/2021   PT End of Session - 12/06/21 0901     Visit Number 3    Number of Visits 8    Date for PT Re-Evaluation 01/02/22    Authorization Type [employer], no auth, 30 VL    PT Start Time 0901    PT Stop Time 0945    PT Time Calculation (min) 44 min    Activity Tolerance Patient tolerated treatment well    Behavior During Therapy [facility] for tasks assessed/performed             Past Medical History:  Diagnosis Date   Anemia    years ago per pt   Cancer ([place]) 2002   left breast   Fatty liver     Past Surgical History:  Procedure Laterality Date   ANTERIOR CERVICAL DECOMP/DISCECTOMY FUSION N/A 10/13/2016   Procedure: C5-6, C6-7 Anterior Cervical Discectomy and Fusion, Allograft, Plate;  Surgeon: [name], MD;  Location: [place];  Service: Orthopedics;  Laterality: N/A;   BREAST SURGERY     reconstructive breast surgery on left after mastectomy   MASTECTOMY Left 2002   TUBAL LIGATION      There were no vitals filed for this visit.   Subjective Assessment - 12/06/21 0903     Subjective Pt notes not much difference and her right knee has been bothering her.  Difficulty with the knee to chest stretches due to knee pain    How long can you sit comfortably? "not very long"    How long can you stand comfortably? "not very long"    How long can you walk comfortably? "walking around stores for about an hour"    Currently in Pain? Yes    Pain Score 6     Pain Location Back    Pain Orientation Lower    Pain Descriptors / Indicators Aching    Pain Type Acute pain    Pain Onset More than a month ago                [facility] PT Assessment - 12/06/21 0001        Assessment   Medical Diagnosis left hip pain; chronic joint pain    Referring Provider (PT) Launa Grill, [facility]                           [facility] Adult PT Treatment/Exercise - 12/06/21 0001       Lumbar Exercises: Aerobic   Recumbent Bike level 1 x 5 min for improved activity tolerance, cues for increased effort      Lumbar Exercises: Supine   Pelvic Tilt 15 reps    Clam 15 reps    Clam Limitations sidelying    Bent Knee Raise 15 reps    Bridge 15 reps    Isometric Hip Flexion 10 reps;2 seconds                     PT Education - 12/06/21 0937     Education Details education on community resources for fitness center ([place]). Education regarding benefits of low-impact cardiovascular exercise/weight reduction    Person(s) Educated Patient  Methods Explanation    Comprehension Verbalized understanding              PT Short Term Goals - 12/02/21 1138       PT SHORT TERM GOAL #1   Title Patient will be independent with HEP in order to improve functional outcomes.    Time 2    Period Weeks    Status On-going    Target Date 12/05/21      PT SHORT TERM GOAL #2   Title Patient will report at least 25% improvement in symptoms for improved quality of life.    Baseline 4/10 pain at evaluation, reports 8/10 left buttocks pain with cooking meals    Time 2    Period Weeks    Status Not Met    Target Date 12/05/21      PT SHORT TERM GOAL #3   Title Demo improved ambulation tolerance/speed as evidenced by 275 ft during 2MWT    Baseline 210 ft with cane    Time 2    Period Weeks    Status On-going    Target Date 12/05/21               PT Long Term Goals - 12/02/21 1139       PT LONG TERM GOAL #1   Title Patient will improve FOTO score by at least 5 points in order to indicate improved tolerance to activity.    Baseline 62% function    Time 4    Period Weeks    Status On-going    Target Date 01/02/22      PT  LONG TERM GOAL #2   Title Improve trunk stablization to reduce pain by improving trunk flexion strength to 3+/5    Baseline 2/5    Time 4    Period Weeks    Status On-going    Target Date 01/02/22                   Plan - 12/06/21 6294     Clinical Impression Statement Tolerating HEP activities well without adverse effects.  Progressing with flexion-biased movements to provide for lumbar decompressoin with emphasis on improving flexion strength to minimize excessive anterior pelvic tilt and enhance lumbar stabilization.  Encouraged to access local fitness facility with emphasis on low-impact cardiovascular exercise and to improve metabolic conditioning. Continued sessions indicated to progress core/LE strength to imrpove stability and activity tolerance    Personal Factors and Comorbidities Comorbidity 1;Time since onset of injury/illness/exacerbation    Comorbidities PMH    Examination-Activity Limitations Carry;Lift;Squat;Locomotion Level;Sleep    Examination-Participation Restrictions Cleaning;Community Activity;Yard Work;Occupation    Stability/Clinical Decision Making Stable/Uncomplicated    Rehab Potential Good    PT Frequency 2x / week    PT Duration 4 weeks    PT Treatment/Interventions ADLs/Self Care Home Management;Gait training;Stair training;Functional mobility training;Therapeutic activities;Therapeutic exercise;Balance training;Patient/family education;Neuromuscular re-education;Manual techniques;Dry needling;Spinal Manipulations;Joint Manipulations    PT Next Visit Plan stabilization as well as stretching    PT Home Exercise Plan ab set, [facility]; 12/30: double knee to chest, bridge, pelvic tilt    Consulted and Agree with Plan of Care Patient             Patient will benefit from skilled therapeutic intervention in order to improve the following deficits and impairments:  Decreased activity tolerance, Decreased strength, Hypermobility, Difficulty walking, Postural  dysfunction, Improper body mechanics, Pain, Obesity  Visit Diagnosis: Muscle weakness (generalized)  Chronic low back pain, unspecified  back pain laterality, unspecified whether sciatica present  Difficulty in walking, not elsewhere classified     Problem List Patient Active Problem List   Diagnosis Date Noted   Bilateral primary osteoarthritis of knee 11/03/2021   DCIS (ductal carcinoma in situ) 09/12/2021   Cervical spinal stenosis 10/13/2016    Toniann Fail, PT 12/06/2021, 9:40 AM  [place], [place], [address] Phone: [phone]   Fax:  [phone]  Name: Alexis Webb MRN: [id number] Date of Birth: 01/03/1966

## 2021-12-08 ENCOUNTER — Encounter (HOSPITAL_COMMUNITY): Payer: Self-pay

## 2021-12-08 ENCOUNTER — Ambulatory Visit (HOSPITAL_COMMUNITY): Payer: 59

## 2021-12-08 ENCOUNTER — Other Ambulatory Visit: Payer: Self-pay

## 2021-12-08 DIAGNOSIS — G8929 Other chronic pain: Secondary | ICD-10-CM | POA: Diagnosis not present

## 2021-12-08 DIAGNOSIS — R262 Difficulty in walking, not elsewhere classified: Secondary | ICD-10-CM | POA: Diagnosis not present

## 2021-12-08 DIAGNOSIS — M545 Low back pain, unspecified: Secondary | ICD-10-CM | POA: Diagnosis not present

## 2021-12-08 DIAGNOSIS — M6281 Muscle weakness (generalized): Secondary | ICD-10-CM

## 2021-12-08 NOTE — Patient Instructions (Addendum)
Hip Flexor Stretch    Lying on back near edge of bed, bend one leg, foot flat. Hang other leg over edge, relaxed, thigh resting entirely on bed for _30-60 seconds. Repeat _2-3_ times. Do _1___ sessions per day. Advanced Exercise: Bend knee back keeping thigh in contact with bed.  http://gt2.exer.us/347   Copyright  VHI. All rights reserved.    HIP: Flexors - Supine    Lie on edge of surface. Place leg off the surface, allow knee to bend. Bring other knee toward chest. Hold _30-60__ seconds. _2-3__ reps per set, _1__ sets per day. Rest lowered foot on stool.  Copyright  VHI. All rights reserved.   Heel Slide - Beginner    Press small of back into mat by tightening abdominals, legs bent, feet relaxed on floor. Exhale, slowly slide one heel out as far as possible without raising lower back. Inhale, slowly slide heel in.   Copyright  VHI. All rights reserved.   Log Roll    Lying on back, bend left knee and place left arm across chest. Roll all in one movement to the right. Reverse to roll to the left. Always move as one unit.   Copyright  VHI. All rights reserved.

## 2021-12-08 NOTE — Therapy (Signed)
Alexis Webb, [place], [address] Phone: [phone]   Fax:  [phone]  Physical Therapy Treatment  Patient Details  Name: Alexis Webb MRN: [id number] Date of Birth: Jan 25, 1966 Referring Provider (PT): Launa Grill, [facility]   Encounter Date: 12/08/2021   PT End of Session - 12/08/21 1031     Visit Number 4    Number of Visits 8    Date for PT Re-Evaluation 01/02/22    Authorization Type [employer], no auth, 30 VL    PT Start Time 1025    PT Stop Time 1105    PT Time Calculation (min) 40 min    Activity Tolerance Patient tolerated treatment well    Behavior During Therapy [facility] for tasks assessed/performed             Past Medical History:  Diagnosis Date   Anemia    years ago per pt   Cancer ([place]) 2002   left breast   Fatty liver     Past Surgical History:  Procedure Laterality Date   ANTERIOR CERVICAL DECOMP/DISCECTOMY FUSION N/A 10/13/2016   Procedure: C5-6, C6-7 Anterior Cervical Discectomy and Fusion, Allograft, Plate;  Surgeon: [name], MD;  Location: [place];  Service: Orthopedics;  Laterality: N/A;   BREAST SURGERY     reconstructive breast surgery on left after mastectomy   MASTECTOMY Left 2002   TUBAL LIGATION      There were no vitals filed for this visit.   Subjective Assessment - 12/08/21 1029     Subjective Pain no longer going down her left leg; only centralized in her low back. Any position for too long aggravates her pain.    How long can you sit comfortably? "not very long"    How long can you stand comfortably? "not very long"    How long can you walk comfortably? "walking around stores for about an hour"    Currently in Pain? Yes    Pain Score 6     Pain Location Back    Pain Orientation Posterior;Medial;Lower    Pain Descriptors / Indicators Aching;Dull    Pain Type Chronic pain    Pain Onset More than a month ago                [facility] Adult PT  Treatment/Exercise - 12/08/21 0001       Self-Care   Self-Care Posture      Lumbar Exercises: Stretches   Hip Flexor Stretch Right;Left;2 reps;30 seconds      Lumbar Exercises: Aerobic   Recumbent Bike level 1 x 5 min for improved activity tolerance, cues for increased effort      Lumbar Exercises: Supine   Heel Slides 10 reps                     PT Education - 12/08/21 1241     Education Details Active vs passive standing, log roll for supine to/from sit, advanced HEP.    Person(s) Educated Patient    Methods Explanation;Handout    Comprehension Verbalized understanding              PT Short Term Goals - 12/08/21 1245       PT SHORT TERM GOAL #1   Title Patient will be independent with HEP in order to improve functional outcomes.    Time 2    Period Weeks    Status On-going    Target Date 12/05/21  PT SHORT TERM GOAL #2   Title Patient will report at least 25% improvement in symptoms for improved quality of life.    Baseline 4/10 pain at evaluation, reports 8/10 left buttocks pain with cooking meals    Time 2    Period Weeks    Status Not Met    Target Date 12/05/21      PT SHORT TERM GOAL #3   Title Demo improved ambulation tolerance/speed as evidenced by 275 ft during 2MWT    Baseline 210 ft with cane    Time 2    Period Weeks    Status On-going    Target Date 12/05/21               PT Long Term Goals - 12/08/21 1245       PT LONG TERM GOAL #1   Title Patient will improve FOTO score by at least 5 points in order to indicate improved tolerance to activity.    Baseline 62% function    Time 4    Period Weeks    Status On-going    Target Date 01/02/22      PT LONG TERM GOAL #2   Title Improve trunk stablization to reduce pain by improving trunk flexion strength to 3+/5    Baseline 2/5    Time 4    Period Weeks    Status On-going    Target Date 01/02/22                   Plan - 12/08/21 1241     Clinical  Impression Statement Active vs passive standing to decrease load on joints overall in body to reduce pain.  Educated on log roll for supine to/from sit to reduce stresses on low back. Advanced HEP and therapeutic exercises to include supine hip flexor stretch, assess car positioning and posture, supine heel slides for core strengthening, log roll. Patient tolerated treatment well today with decreased pain complaints at end of session. Patient exhibitied right knee pain when attempting sit to stand transfers. Patient would continue to benefit from skilled physical therapy to reduce impairment and improve function.    Personal Factors and Comorbidities Comorbidity 1;Time since onset of injury/illness/exacerbation    Comorbidities PMH    Examination-Activity Limitations Carry;Lift;Squat;Locomotion Level;Sleep    Examination-Participation Restrictions Cleaning;Community Activity;Yard Work;Occupation    Stability/Clinical Decision Making Stable/Uncomplicated    Rehab Potential Good    PT Frequency 2x / week    PT Duration 4 weeks    PT Treatment/Interventions ADLs/Self Care Home Management;Gait training;Stair training;Functional mobility training;Therapeutic activities;Therapeutic exercise;Balance training;Patient/family education;Neuromuscular re-education;Manual techniques;Dry needling;Spinal Manipulations;Joint Manipulations    PT Next Visit Plan stabilization as well as stretching    PT Home Exercise Plan ab set, [facility]; 12/30: double knee to chest, bridge, pelvic tilt; 1/5 - supine hip flexor stretch, supiine heel slide, active standing, log roll    Consulted and Agree with Plan of Care Patient             Patient will benefit from skilled therapeutic intervention in order to improve the following deficits and impairments:  Decreased activity tolerance, Decreased strength, Hypermobility, Difficulty walking, Postural dysfunction, Improper body mechanics, Pain, Obesity  Visit Diagnosis: Muscle  weakness (generalized)  Chronic low back pain, unspecified back pain laterality, unspecified whether sciatica present  Difficulty in walking, not elsewhere classified     Problem List Patient Active Problem List   Diagnosis Date Noted   Bilateral primary osteoarthritis of knee 11/03/2021  DCIS (ductal carcinoma in situ) 09/12/2021   Cervical spinal stenosis 10/13/2016   Floria Raveling. Hartnett-Rands, MS, PT [facility] [place] [phone]  Jeannie Done, PT 12/08/2021, 12:46 PM  [place] [address] [place], [place], [address] Phone: [phone]   Fax:  [phone]  Name: Aalina Brege Reim MRN: [id number] Date of Birth: Feb 12, 1966

## 2021-12-12 ENCOUNTER — Ambulatory Visit (HOSPITAL_COMMUNITY): Payer: 59

## 2021-12-12 ENCOUNTER — Other Ambulatory Visit: Payer: Self-pay

## 2021-12-12 DIAGNOSIS — R262 Difficulty in walking, not elsewhere classified: Secondary | ICD-10-CM

## 2021-12-12 DIAGNOSIS — M545 Low back pain, unspecified: Secondary | ICD-10-CM | POA: Diagnosis not present

## 2021-12-12 DIAGNOSIS — M6281 Muscle weakness (generalized): Secondary | ICD-10-CM | POA: Diagnosis not present

## 2021-12-12 DIAGNOSIS — G8929 Other chronic pain: Secondary | ICD-10-CM | POA: Diagnosis not present

## 2021-12-12 NOTE — Therapy (Signed)
[place], [place], [address] Phone: [phone]   Fax:  [phone]  Physical Therapy Treatment  Patient Details  Name: Alexis Webb MRN: [id number] Date of Birth: 08/19/1966 Referring Provider (PT): Launa Grill, [facility]   Encounter Date: 12/12/2021   PT End of Session - 12/12/21 0086     Visit Number 5    Number of Visits 8    Date for PT Re-Evaluation 01/02/22    Authorization Type [employer], no auth, 30 VL    PT Start Time 0947    PT Stop Time 1030    PT Time Calculation (min) 43 min    Activity Tolerance Patient tolerated treatment well    Behavior During Therapy [facility] for tasks assessed/performed             Past Medical History:  Diagnosis Date   Anemia    years ago per pt   Cancer ([place]) 2002   left breast   Fatty liver     Past Surgical History:  Procedure Laterality Date   ANTERIOR CERVICAL DECOMP/DISCECTOMY FUSION N/A 10/13/2016   Procedure: C5-6, C6-7 Anterior Cervical Discectomy and Fusion, Allograft, Plate;  Surgeon: [name], MD;  Location: [place];  Service: Orthopedics;  Laterality: N/A;   BREAST SURGERY     reconstructive breast surgery on left after mastectomy   MASTECTOMY Left 2002   TUBAL LIGATION      There were no vitals filed for this visit.   Subjective Assessment - 12/12/21 0951     Subjective Pt notes no LLE pain at present and pain isolated to low back and feels fairly symmetric    How long can you sit comfortably? "not very long"    How long can you stand comfortably? "not very long"    How long can you walk comfortably? "walking around stores for about an hour"    Currently in Pain? Yes    Pain Score 5    with household activities   Pain Location Back    Pain Orientation Lower    Pain Descriptors / Indicators Aching;Dull    Pain Type Chronic pain    Pain Onset More than a month ago                               [facility] Adult PT  Treatment/Exercise - 12/12/21 0001       Lumbar Exercises: Stretches   Single Knee to Chest Stretch 3 reps;30 seconds    Hip Flexor Stretch Right;Left;2 reps;30 seconds      Lumbar Exercises: Supine   Ab Set 10 reps;3 seconds    Pelvic Tilt 15 reps    Clam 15 reps    Clam Limitations sidelying    Bent Knee Raise 15 reps    Bridge 15 reps    Isometric Hip Flexion 10 reps;2 seconds                       PT Short Term Goals - 12/08/21 1245       PT SHORT TERM GOAL #1   Title Patient will be independent with HEP in order to improve functional outcomes.    Time 2    Period Weeks    Status On-going    Target Date 12/05/21      PT SHORT TERM GOAL #2   Title Patient will report at least  25% improvement in symptoms for improved quality of life.    Baseline 4/10 pain at evaluation, reports 8/10 left buttocks pain with cooking meals    Time 2    Period Weeks    Status Not Met    Target Date 12/05/21      PT SHORT TERM GOAL #3   Title Demo improved ambulation tolerance/speed as evidenced by 275 ft during 2MWT    Baseline 210 ft with cane    Time 2    Period Weeks    Status On-going    Target Date 12/05/21               PT Long Term Goals - 12/08/21 1245       PT LONG TERM GOAL #1   Title Patient will improve FOTO score by at least 5 points in order to indicate improved tolerance to activity.    Baseline 62% function    Time 4    Period Weeks    Status On-going    Target Date 01/02/22      PT LONG TERM GOAL #2   Title Improve trunk stablization to reduce pain by improving trunk flexion strength to 3+/5    Baseline 2/5    Time 4    Period Weeks    Status On-going    Target Date 01/02/22                   Plan - 12/12/21 1031     Clinical Impression Statement Pt demonstrates excellent HEP recall and progressing with lumbar stabilization.  Pt notes centralization of LLE pain and mostly experiencing discomort in central low back.  Continued sessions to progress lumbar strengthening    Personal Factors and Comorbidities Comorbidity 1;Time since onset of injury/illness/exacerbation    Comorbidities PMH    Examination-Activity Limitations Carry;Lift;Squat;Locomotion Level;Sleep    Examination-Participation Restrictions Cleaning;Community Activity;Yard Work;Occupation    Stability/Clinical Decision Making Stable/Uncomplicated    Rehab Potential Good    PT Frequency 2x / week    PT Duration 4 weeks    PT Treatment/Interventions ADLs/Self Care Home Management;Gait training;Stair training;Functional mobility training;Therapeutic activities;Therapeutic exercise;Balance training;Patient/family education;Neuromuscular re-education;Manual techniques;Dry needling;Spinal Manipulations;Joint Manipulations    PT Next Visit Plan stabilization as well as stretching    PT Home Exercise Plan ab set, [facility]; 12/30: double knee to chest, bridge, pelvic tilt; 1/5 - supine hip flexor stretch, supiine heel slide, active standing, log roll    Consulted and Agree with Plan of Care Patient             Patient will benefit from skilled therapeutic intervention in order to improve the following deficits and impairments:  Decreased activity tolerance, Decreased strength, Hypermobility, Difficulty walking, Postural dysfunction, Improper body mechanics, Pain, Obesity  Visit Diagnosis: Muscle weakness (generalized)  Chronic low back pain, unspecified back pain laterality, unspecified whether sciatica present  Difficulty in walking, not elsewhere classified     Problem List Patient Active Problem List   Diagnosis Date Noted   Bilateral primary osteoarthritis of knee 11/03/2021   DCIS (ductal carcinoma in situ) 09/12/2021   Cervical spinal stenosis 10/13/2016    Toniann Fail, PT 12/12/2021, 10:35 AM  [place], [place], [address] Phone: [phone]   Fax:   [phone]  Name: Alexis Webb MRN: [id number] Date of Birth: 1966/11/05

## 2021-12-15 ENCOUNTER — Encounter (HOSPITAL_COMMUNITY): Payer: 59

## 2021-12-20 ENCOUNTER — Other Ambulatory Visit: Payer: Self-pay

## 2021-12-20 ENCOUNTER — Ambulatory Visit (HOSPITAL_COMMUNITY): Payer: 59 | Admitting: Physical Therapy

## 2021-12-20 DIAGNOSIS — M545 Low back pain, unspecified: Secondary | ICD-10-CM

## 2021-12-20 DIAGNOSIS — G8929 Other chronic pain: Secondary | ICD-10-CM | POA: Diagnosis not present

## 2021-12-20 DIAGNOSIS — R262 Difficulty in walking, not elsewhere classified: Secondary | ICD-10-CM | POA: Diagnosis not present

## 2021-12-20 DIAGNOSIS — M6281 Muscle weakness (generalized): Secondary | ICD-10-CM

## 2021-12-20 NOTE — Therapy (Signed)
[place], [place], [address] Phone: [phone]   Fax:  [phone]  Physical Therapy Treatment  Patient Details  Name: Alexis Webb MRN: [id number] Date of Birth: [birth year] Referring Provider (PT): Launa Grill, [facility]   Encounter Date: 12/20/2021   PT End of Session - 12/20/21 1051     Visit Number 6    Number of Visits 8    Date for PT Re-Evaluation 01/02/22    Authorization Type [employer], no auth, 30 VL    PT Start Time 1049    PT Stop Time 1129    PT Time Calculation (min) 40 min    Activity Tolerance Patient tolerated treatment well    Behavior During Therapy [facility] for tasks assessed/performed             Past Medical History:  Diagnosis Date   Anemia    years ago per pt   Cancer ([place]) 2002   left breast   Fatty liver     Past Surgical History:  Procedure Laterality Date   ANTERIOR CERVICAL DECOMP/DISCECTOMY FUSION N/A 10/13/2016   Procedure: C5-6, C6-7 Anterior Cervical Discectomy and Fusion, Allograft, Plate;  Surgeon: [name], MD;  Location: [place];  Service: Orthopedics;  Laterality: N/A;   BREAST SURGERY     reconstructive breast surgery on left after mastectomy   MASTECTOMY Left 2002   TUBAL LIGATION      There were no vitals filed for this visit.   Subjective Assessment - 12/20/21 1053     Subjective Patient reports that there has been a slight decrease in her back pain, but not by much. She reports that stairs are still giving her an issue, but that this is more due to her Rt knee pain.    How long can you sit comfortably? "not very long"    How long can you stand comfortably? "not very long"    How long can you walk comfortably? "walking around stores for about an hour"    Currently in Pain? Yes    Pain Score 4     Pain Location Hip    Pain Orientation Left    Pain Onset More than a month ago                               [facility] Adult PT  Treatment/Exercise - 12/20/21 0001       Lumbar Exercises: Stretches   Single Knee to Chest Stretch 3 reps;30 seconds    Lower Trunk Rotation 1 rep;10 seconds;Other (comment)   2-3seconds holds   Pelvic Tilt 10 reps;5 seconds      Lumbar Exercises: Standing   Other Standing Lumbar Exercises Unilateral banded farmer carry 20lbs 2x61f each    Other Standing Lumbar Exercises Rotational Palloff press green 3x12 ea and Side Bending Palloff press green 2x8 ea      Lumbar Exercises: Supine   Bridge 15 reps   with maintained PPT                      PT Short Term Goals - 12/08/21 1245       PT SHORT TERM GOAL #1   Title Patient will be independent with HEP in order to improve functional outcomes.    Time 2    Period Weeks    Status On-going    Target Date 12/05/21  PT SHORT TERM GOAL #2   Title Patient will report at least 25% improvement in symptoms for improved quality of life.    Baseline 4/10 pain at evaluation, reports 8/10 left buttocks pain with cooking meals    Time 2    Period Weeks    Status Not Met    Target Date 12/05/21      PT SHORT TERM GOAL #3   Title Demo improved ambulation tolerance/speed as evidenced by 275 ft during 2MWT    Baseline 210 ft with cane    Time 2    Period Weeks    Status On-going    Target Date 12/05/21               PT Long Term Goals - 12/08/21 1245       PT LONG TERM GOAL #1   Title Patient will improve FOTO score by at least 5 points in order to indicate improved tolerance to activity.    Baseline 62% function    Time 4    Period Weeks    Status On-going    Target Date 01/02/22      PT LONG TERM GOAL #2   Title Improve trunk stablization to reduce pain by improving trunk flexion strength to 3+/5    Baseline 2/5    Time 4    Period Weeks    Status On-going    Target Date 01/02/22                   Plan - 12/20/21 1147     Clinical Impression Statement Patient tolerated treatment well  during today's session with the exception of her Rt knee pain which was the most limiting factor in her performance of exercise. There was a difference in the patient's ability to carry out both the rotational and side bending based Palloff presses, although there was no significant difference in  unilateral Farmer's Carries. Lateral trunk lean opposite of the side of carried weight was noted on each side during the Farmer's Carries, but the patient was able to reduce the degree of lean following verbal and visual cueing.    Personal Factors and Comorbidities Comorbidity 1;Time since onset of injury/illness/exacerbation    Comorbidities PMH    Examination-Activity Limitations Carry;Lift;Squat;Locomotion Level;Sleep    Examination-Participation Restrictions Cleaning;Community Activity;Yard Work;Occupation    Stability/Clinical Decision Making Stable/Uncomplicated    Rehab Potential Good    PT Frequency 2x / week    PT Duration 4 weeks    PT Treatment/Interventions ADLs/Self Care Home Management;Gait training;Stair training;Functional mobility training;Therapeutic activities;Therapeutic exercise;Balance training;Patient/family education;Neuromuscular re-education;Manual techniques;Dry needling;Spinal Manipulations;Joint Manipulations    PT Next Visit Plan stabilization as well as stretching; Palloff presses well tolerated / most likely avoid Farmer's Carries as they aggravated the Rt knee.    PT Home Exercise Plan ab set, [facility]; 12/30: double knee to chest, bridge, pelvic tilt; 1/5 - supine hip flexor stretch, supiine heel slide, active standing, log roll    Consulted and Agree with Plan of Care Patient             Patient will benefit from skilled therapeutic intervention in order to improve the following deficits and impairments:  Decreased activity tolerance, Decreased strength, Hypermobility, Difficulty walking, Postural dysfunction, Improper body mechanics, Pain, Obesity  Visit  Diagnosis: Muscle weakness (generalized)  Chronic low back pain, unspecified back pain laterality, unspecified whether sciatica present  Difficulty in walking, not elsewhere classified     Problem List Patient Active Problem  List   Diagnosis Date Noted   Bilateral primary osteoarthritis of knee 11/03/2021   DCIS (ductal carcinoma in situ) 09/12/2021   Cervical spinal stenosis 10/13/2016    11:53 AM,12/20/21 Adalberto Cole, DPT [place], [place], [address] Phone: [phone]   Fax:  [phone]  Name: Alexis Webb MRN: [id number] Date of Birth: Dec 02, 1966

## 2021-12-22 ENCOUNTER — Encounter (HOSPITAL_COMMUNITY): Payer: Self-pay

## 2021-12-22 ENCOUNTER — Ambulatory Visit (HOSPITAL_COMMUNITY): Payer: 59

## 2021-12-22 ENCOUNTER — Other Ambulatory Visit: Payer: Self-pay

## 2021-12-22 DIAGNOSIS — G8929 Other chronic pain: Secondary | ICD-10-CM | POA: Diagnosis not present

## 2021-12-22 DIAGNOSIS — R262 Difficulty in walking, not elsewhere classified: Secondary | ICD-10-CM | POA: Diagnosis not present

## 2021-12-22 DIAGNOSIS — M545 Low back pain, unspecified: Secondary | ICD-10-CM

## 2021-12-22 DIAGNOSIS — M6281 Muscle weakness (generalized): Secondary | ICD-10-CM

## 2021-12-22 NOTE — Therapy (Signed)
[place], [place], [address] Phone: [phone]   Fax:  [phone]  Physical Therapy Treatment  Patient Details  Name: Alexis Webb MRN: [id number] Date of Birth: 07/30/1966 Referring Provider (PT): Launa Grill, [facility]   Encounter Date: 12/22/2021   PT End of Session - 12/22/21 1018     Visit Number 7    Number of Visits 8    Date for PT Re-Evaluation 01/02/22    Authorization Type [employer], no auth, 30 VL    PT Start Time 1019    PT Stop Time 1100    PT Time Calculation (min) 41 min    Activity Tolerance Patient tolerated treatment well    Behavior During Therapy [facility] for tasks assessed/performed             Past Medical History:  Diagnosis Date   Anemia    years ago per pt   Cancer ([place]) 2002   left breast   Fatty liver     Past Surgical History:  Procedure Laterality Date   ANTERIOR CERVICAL DECOMP/DISCECTOMY FUSION N/A 10/13/2016   Procedure: C5-6, C6-7 Anterior Cervical Discectomy and Fusion, Allograft, Plate;  Surgeon: [name], MD;  Location: [place];  Service: Orthopedics;  Laterality: N/A;   BREAST SURGERY     reconstructive breast surgery on left after mastectomy   MASTECTOMY Left 2002   TUBAL LIGATION      There were no vitals filed for this visit.   Subjective Assessment - 12/22/21 1018     Subjective Low back pain is doing pretty good today but she reports she purposely hasn't done much to let her pain come down.    Limitations House hold activities;Lifting    How long can you sit comfortably? "not very long"    How long can you stand comfortably? "not very long"    How long can you walk comfortably? "walking around stores for about an hour"    Currently in Pain? Yes    Pain Score 1     Pain Location Back    Pain Orientation Lower;Posterior    Pain Descriptors / Indicators Aching    Pain Onset More than a month ago               [facility] Adult PT  Treatment/Exercise - 12/22/21 0001       Self-Care   Self-Care Lifting;Posture;ADL's    ADL's cooking, dishes, laundry, sweeping/mopping    Lifting from bed/chair height due to right knee pain    Posture scapular retraction and active standing with soft knees      Lumbar Exercises: Standing   Other Standing Lumbar Exercises Rotational Palloff press green x10 ea and Side Bending Palloff press green x10 ea               PT Education - 12/22/21 1101     Education Details Standing, lifting, energy conservation techniques to decrease load on low back. Moving forward with right total knee replacement.    Person(s) Educated Patient    Methods Explanation;Handout    Comprehension Verbalized understanding              PT Short Term Goals - 12/08/21 1245       PT SHORT TERM GOAL #1   Title Patient will be independent with HEP in order to improve functional outcomes.    Time 2    Period Weeks    Status On-going  Target Date 12/05/21      PT SHORT TERM GOAL #2   Title Patient will report at least 25% improvement in symptoms for improved quality of life.    Baseline 4/10 pain at evaluation, reports 8/10 left buttocks pain with cooking meals    Time 2    Period Weeks    Status Not Met    Target Date 12/05/21      PT SHORT TERM GOAL #3   Title Demo improved ambulation tolerance/speed as evidenced by 275 ft during 2MWT    Baseline 210 ft with cane    Time 2    Period Weeks    Status On-going    Target Date 12/05/21               PT Long Term Goals - 12/08/21 1245       PT LONG TERM GOAL #1   Title Patient will improve FOTO score by at least 5 points in order to indicate improved tolerance to activity.    Baseline 62% function    Time 4    Period Weeks    Status On-going    Target Date 01/02/22      PT LONG TERM GOAL #2   Title Improve trunk stablization to reduce pain by improving trunk flexion strength to 3+/5    Baseline 2/5    Time 4    Period  Weeks    Status On-going    Target Date 01/02/22                   Plan - 12/22/21 1018     Clinical Impression Statement Session focused on education of how to perform IADLs with less stress on back and in more proper positioning with abdominal activation and energy conservation techniques. Patient's ability to lift limited to bed height due to right knee pain. Discussed moving forward with finding an orthopedic physician to perform her right TKA she has been putting off. Patient receptive to education on IADLs and verbalize she thought these techniques would help a lot. Patient would continue to benefit from skilled physical therapy to reduce impairment and improve function.    Personal Factors and Comorbidities Comorbidity 1;Time since onset of injury/illness/exacerbation    Comorbidities PMH    Examination-Activity Limitations Carry;Lift;Squat;Locomotion Level;Sleep    Examination-Participation Restrictions Cleaning;Community Activity;Yard Work;Occupation    Stability/Clinical Decision Making Stable/Uncomplicated    Rehab Potential Good    PT Frequency 2x / week    PT Duration 4 weeks    PT Treatment/Interventions ADLs/Self Care Home Management;Gait training;Stair training;Functional mobility training;Therapeutic activities;Therapeutic exercise;Balance training;Patient/family education;Neuromuscular re-education;Manual techniques;Dry needling;Spinal Manipulations;Joint Manipulations    PT Next Visit Plan stabilization as well as stretching; Palloff presses well tolerated / most likely avoid Farmer's Carries as they aggravated the Rt knee.    PT Home Exercise Plan ab set, [facility]; 12/30: double knee to chest, bridge, pelvic tilt; 1/5 - supine hip flexor stretch, supiine heel slide, active standing, log roll; 1/19 repeated extension standing, energy conservation, lifting strategies    Consulted and Agree with Plan of Care Patient             Patient will benefit from skilled  therapeutic intervention in order to improve the following deficits and impairments:  Decreased activity tolerance, Decreased strength, Hypermobility, Difficulty walking, Postural dysfunction, Improper body mechanics, Pain, Obesity  Visit Diagnosis: Muscle weakness (generalized)  Chronic low back pain, unspecified back pain laterality, unspecified whether sciatica present  Difficulty in walking,  not elsewhere classified     Problem List Patient Active Problem List   Diagnosis Date Noted   Bilateral primary osteoarthritis of knee 11/03/2021   DCIS (ductal carcinoma in situ) 09/12/2021   Cervical spinal stenosis 10/13/2016   Floria Raveling. Hartnett-Rands, MS, PT [place] [phone]  Jeannie Done, PT 12/22/2021, 12:27 PM  [place] [address] [place], [place], [address] Phone: [phone]   Fax:  [phone]  Name: Jhayla Podgorski Schear MRN: [id number] Date of Birth: September 01, 1966

## 2021-12-22 NOTE — Patient Instructions (Signed)
Trunk Extension    Standing, place back of open hands on low back. Straighten spine then arch the back and move shoulders back. Repeat 10__ times per session. Do during rest break from bending over cleaning, gardening,  Variation: Seated  Copyright  VHI. All rights reserved.    Lifting Principles  Maintain proper posture and head alignment. Slide object as close as possible before lifting. Move obstacles out of the way. Test before lifting; ask for help if too heavy. Tighten stomach muscles without holding breath. Use smooth movements; do not jerk. Use legs to do the work, and pivot with feet. Distribute the work load symmetrically and close to the center of trunk. Push instead of pull whenever possible.  Copyright  VHI. All rights reserved.   HOUSEHOLD: Sweeping    Hold broom with both hands to sweep floor. We have feet; not roots.   Copyright  VHI. All rights reserved.   Energy Conservation Techniques  Sit for as many activities as possible. Use slow, smooth movements.  Rushing increases discomfort. Determine the necessity of performing the task.  Simplify those tasks that are necessary.  (Get clothes out of the dryer when they are warm instead of ironing, let dishes air dry, etc.) Take frequent rests both during and between activities.  Avoid repetitive tasks. Pre-plan your activities; try a daily and/or weekly schedule.  Spread out the activities that are most fatiguing (break up cleaning tasks over multiple days). Remember to plan a balance of work, rest and recreation. Consider the best time for each activity.  Do the most exertive task when you have the most energy. Don't carry items if you can push them.  Slide, don't lift. Push, don't pull. Utilize two hands when appropriate. Maintain good posture and use proper body mechanics. Avoid remaining in one position for too long. When lifting, bend at the knees, not at the waist.  Exhale when bending down, inhale  when straightening up. Carry objects as close to your body and as near to the center of the pelvis.  11. Avoid wasted body movements (position yourself for the task so that you avoid bending, twisting, etc.   when possible). 12. Select the best working environment.  Consider lighting, ventilation, clothing, and equipment. 47. Organize your storage areas, making the items you use daily convenient.  Store heaviest items at waist height.  Store frequently used items between shoulders and knee height.  Consider leaving frequently used       items on countertops.  (You can organize in storage baskets based on time used/purpose). 14. Feelings and emotions can be real causes of fatigue.  Try to avoid unnecessary worry, irritation, or  frustration.  Avoid stress, it can also be a source of fatigue. 15. Get help from other people for difficult tasks. 16. Explore equipment or items that may be able to do the job for you with greater ease.  (Electric can openers, blenders, lightweight items for cleaning, etc.)

## 2021-12-30 ENCOUNTER — Ambulatory Visit (HOSPITAL_COMMUNITY): Payer: 59 | Admitting: Physical Therapy

## 2022-01-02 ENCOUNTER — Other Ambulatory Visit: Payer: Self-pay

## 2022-01-02 ENCOUNTER — Encounter (HOSPITAL_COMMUNITY): Payer: Self-pay | Admitting: Physical Therapy

## 2022-01-02 ENCOUNTER — Ambulatory Visit (HOSPITAL_COMMUNITY): Payer: 59 | Admitting: Physical Therapy

## 2022-01-02 DIAGNOSIS — G8929 Other chronic pain: Secondary | ICD-10-CM | POA: Diagnosis not present

## 2022-01-02 DIAGNOSIS — M6281 Muscle weakness (generalized): Secondary | ICD-10-CM | POA: Diagnosis not present

## 2022-01-02 DIAGNOSIS — R262 Difficulty in walking, not elsewhere classified: Secondary | ICD-10-CM

## 2022-01-02 DIAGNOSIS — M545 Low back pain, unspecified: Secondary | ICD-10-CM

## 2022-01-02 NOTE — Therapy (Signed)
[place], [place], [address] Phone: [phone]   Fax:  [phone]  Physical Therapy Treatment/Discharge Summary  Patient Details  Name: Alexis Webb MRN: [id number] Date of Birth: Oct 28, 1966 Referring Provider (PT): Launa Grill, [facility]   Encounter Date: 01/02/2022  PHYSICAL THERAPY DISCHARGE SUMMARY  Visits from Start of Care: 8  Current functional level related to goals / functional outcomes: See below   Remaining deficits: See below   Education / Equipment: See below   Patient agrees to discharge. Patient goals were met. Patient is being discharged due to being pleased with the current functional level.    PT End of Session - 01/02/22 1405     Visit Number 8    Number of Visits 8    Date for PT Re-Evaluation 01/02/22    Authorization Type [employer], no auth, 30 VL    PT Start Time 1405    PT Stop Time 1425    PT Time Calculation (min) 20 min    Activity Tolerance Patient tolerated treatment well    Behavior During Therapy WFL for tasks assessed/performed             Past Medical History:  Diagnosis Date   Anemia    years ago per pt   Cancer ([place]) 2002   left breast   Fatty liver     Past Surgical History:  Procedure Laterality Date   ANTERIOR CERVICAL DECOMP/DISCECTOMY FUSION N/A 10/13/2016   Procedure: C5-6, C6-7 Anterior Cervical Discectomy and Fusion, Allograft, Plate;  Surgeon: [name], MD;  Location: [place];  Service: Orthopedics;  Laterality: N/A;   BREAST SURGERY     reconstructive breast surgery on left after mastectomy   MASTECTOMY Left 2002   TUBAL LIGATION      There were no vitals filed for this visit.   Subjective Assessment - 01/02/22 1406     Subjective Her HEP is going well. Her L hip is good but her back still bothering her. She also has trouble with her R knee. Left hip is 100% better.    Limitations House hold activities;Lifting    How long can  you sit comfortably? "not very long"    How long can you stand comfortably? "not very long"    How long can you walk comfortably? "walking around stores for about an hour"    Currently in Pain? Yes    Pain Score 4     Pain Location Back    Pain Orientation Lower    Pain Descriptors / Indicators Aching    Pain Type Chronic pain    Pain Onset More than a month ago                [facility] PT Assessment - 01/02/22 0001       Assessment   Medical Diagnosis left hip pain; chronic joint pain    Referring Provider (PT) Launa Grill, PAC      Precautions   Precautions None      Restrictions   Weight Bearing Restrictions No      Balance Screen   Has the patient fallen in the past 6 months No    Has the patient had a decrease in activity level because of a fear of falling?  No    Is the patient reluctant to leave their home because of a fear of falling?  No      Home Ecologist  residence    Living Arrangements Alone      Prior Function   Level of Independence Independent with community mobility with device    Vocation Unemployed      Observation/Other Assessments   Focus on Therapeutic Outcomes (FOTO)  99% function      Ambulation/Gait   Ambulation/Gait Yes    Ambulation/Gait Assistance 6: Modified independent (Device/Increase time)    Ambulation Distance (Feet) 300 Feet    Assistive device None    Gait Pattern --   antalgic R knee   Ambulation Surface Level;Indoor    Gait Comments 2MWT                                    PT Education - 01/02/22 1406     Education Details HEP, reassessment findings    Person(s) Educated Patient    Methods Explanation;Demonstration    Comprehension Verbalized understanding;Returned demonstration              PT Short Term Goals - 01/02/22 1411       PT SHORT TERM GOAL #1   Title Patient will be independent with HEP in order to improve functional outcomes.    Time 2     Period Weeks    Status Achieved    Target Date 12/05/21      PT SHORT TERM GOAL #2   Title Patient will report at least 25% improvement in symptoms for improved quality of life.    Baseline 0/10 L hip    Time 2    Period Weeks    Status Achieved    Target Date 12/05/21      PT SHORT TERM GOAL #3   Title Demo improved ambulation tolerance/speed as evidenced by 275 ft during 2MWT    Baseline 210 ft with cane    Time 2    Period Weeks    Status Achieved    Target Date 12/05/21               PT Long Term Goals - 01/02/22 1417       PT LONG TERM GOAL #1   Title Patient will improve FOTO score by at least 5 points in order to indicate improved tolerance to activity.    Baseline 62% function    Time 4    Period Weeks    Status Achieved    Target Date 01/02/22      PT LONG TERM GOAL #2   Title Improve trunk stablization to reduce pain by improving trunk flexion strength to 3+/5    Baseline able to complete partial sit up    Time 4    Period Weeks    Status Achieved    Target Date 01/02/22                   Plan - 01/02/22 1405     Clinical Impression Statement Patient has met all short and long term goals for L hip with improved symptoms, strength, gait and functional mobility and ability to complete HEP. She remains limited by back pain and R knee pain. Patient educated on reassessment findings, and returning to PT if needed. Patient discharged from physical therapy at this time.    Personal Factors and Comorbidities Comorbidity 1;Time since onset of injury/illness/exacerbation    Comorbidities PMH    Examination-Activity Limitations Carry;Lift;Squat;Locomotion Level;Sleep    Examination-Participation Restrictions Cleaning;Community Activity;Yard Work;Occupation  Stability/Clinical Decision Making Stable/Uncomplicated    Rehab Potential Good    PT Frequency 2x / week    PT Duration 4 weeks    PT Treatment/Interventions ADLs/Self Care Home Management;Gait  training;Stair training;Functional mobility training;Therapeutic activities;Therapeutic exercise;Balance training;Patient/family education;Neuromuscular re-education;Manual techniques;Dry needling;Spinal Manipulations;Joint Manipulations    PT Next Visit Plan n/a    PT Home Exercise Plan ab set, [facility]; 12/30: double knee to chest, bridge, pelvic tilt; 1/5 - supine hip flexor stretch, supiine heel slide, active standing, log roll; 1/19 repeated extension standing, energy conservation, lifting strategies    Consulted and Agree with Plan of Care Patient             Patient will benefit from skilled therapeutic intervention in order to improve the following deficits and impairments:  Decreased activity tolerance, Decreased strength, Hypermobility, Difficulty walking, Postural dysfunction, Improper body mechanics, Pain, Obesity  Visit Diagnosis: Muscle weakness (generalized)  Chronic low back pain, unspecified back pain laterality, unspecified whether sciatica present  Difficulty in walking, not elsewhere classified     Problem List Patient Active Problem List   Diagnosis Date Noted   Bilateral primary osteoarthritis of knee 11/03/2021   DCIS (ductal carcinoma in situ) 09/12/2021   Cervical spinal stenosis 10/13/2016    2:37 PM, 01/02/22 Mearl Latin PT, DPT Physical Therapist at [place], [place], [address] Phone: [phone]   Fax:  [phone]  Name: Alexis Webb MRN: [id number] Date of Birth: 06-13-1966

## 2022-01-05 DIAGNOSIS — E785 Hyperlipidemia, unspecified: Secondary | ICD-10-CM | POA: Diagnosis not present

## 2022-01-05 DIAGNOSIS — Z1329 Encounter for screening for other suspected endocrine disorder: Secondary | ICD-10-CM | POA: Diagnosis not present

## 2022-01-05 DIAGNOSIS — Z0001 Encounter for general adult medical examination with abnormal findings: Secondary | ICD-10-CM | POA: Diagnosis not present

## 2022-01-10 ENCOUNTER — Other Ambulatory Visit: Payer: Self-pay | Admitting: Physician Assistant

## 2022-01-10 ENCOUNTER — Other Ambulatory Visit (HOSPITAL_COMMUNITY): Payer: Self-pay | Admitting: Physician Assistant

## 2022-01-10 DIAGNOSIS — R222 Localized swelling, mass and lump, trunk: Secondary | ICD-10-CM

## 2022-01-10 DIAGNOSIS — M810 Age-related osteoporosis without current pathological fracture: Secondary | ICD-10-CM

## 2022-01-16 DIAGNOSIS — Z6839 Body mass index (BMI) 39.0-39.9, adult: Secondary | ICD-10-CM | POA: Diagnosis not present

## 2022-01-16 DIAGNOSIS — M47812 Spondylosis without myelopathy or radiculopathy, cervical region: Secondary | ICD-10-CM | POA: Diagnosis not present

## 2022-01-16 DIAGNOSIS — M5416 Radiculopathy, lumbar region: Secondary | ICD-10-CM | POA: Diagnosis not present

## 2022-01-19 ENCOUNTER — Ambulatory Visit (HOSPITAL_COMMUNITY)
Admission: RE | Admit: 2022-01-19 | Discharge: 2022-01-19 | Disposition: A | Discharge status: Home / Self Care | Payer: 59 | Source: Ambulatory Visit | Attending: Physician Assistant | Admitting: Physician Assistant

## 2022-01-19 ENCOUNTER — Other Ambulatory Visit (HOSPITAL_COMMUNITY): Payer: 59

## 2022-01-19 ENCOUNTER — Other Ambulatory Visit (HOSPITAL_COMMUNITY): Payer: Self-pay | Admitting: Neurosurgery

## 2022-01-19 ENCOUNTER — Other Ambulatory Visit: Payer: Self-pay

## 2022-01-19 ENCOUNTER — Other Ambulatory Visit: Payer: Self-pay | Admitting: Neurosurgery

## 2022-01-19 DIAGNOSIS — R222 Localized swelling, mass and lump, trunk: Secondary | ICD-10-CM | POA: Insufficient documentation

## 2022-01-19 DIAGNOSIS — R221 Localized swelling, mass and lump, neck: Secondary | ICD-10-CM | POA: Diagnosis not present

## 2022-01-19 DIAGNOSIS — M5416 Radiculopathy, lumbar region: Secondary | ICD-10-CM

## 2022-01-24 ENCOUNTER — Other Ambulatory Visit (HOSPITAL_COMMUNITY): Payer: Self-pay | Admitting: Specialist

## 2022-01-24 DIAGNOSIS — R1312 Dysphagia, oropharyngeal phase: Secondary | ICD-10-CM

## 2022-01-27 ENCOUNTER — Other Ambulatory Visit (HOSPITAL_COMMUNITY): Payer: Self-pay

## 2022-01-27 MED ORDER — LETROZOLE 2.5 MG PO TABS: 2.5000 mg | ORAL_TABLET | Freq: Every day | ORAL | 3 refills | Status: DC

## 2022-01-27 NOTE — Telephone Encounter (Signed)
Chart reviewed. Patient's Letrozole refilled per Dr. Delton Coombes

## 2022-02-03 ENCOUNTER — Other Ambulatory Visit: Payer: Self-pay

## 2022-02-03 ENCOUNTER — Ambulatory Visit (HOSPITAL_COMMUNITY)
Admission: RE | Admit: 2022-02-03 | Discharge: 2022-02-03 | Disposition: A | Discharge status: Home / Self Care | Payer: 59 | Source: Ambulatory Visit | Attending: Neurosurgery | Admitting: Neurosurgery

## 2022-02-03 DIAGNOSIS — M5416 Radiculopathy, lumbar region: Secondary | ICD-10-CM | POA: Diagnosis not present

## 2022-02-03 DIAGNOSIS — M48061 Spinal stenosis, lumbar region without neurogenic claudication: Secondary | ICD-10-CM | POA: Diagnosis not present

## 2022-02-03 DIAGNOSIS — M4807 Spinal stenosis, lumbosacral region: Secondary | ICD-10-CM | POA: Diagnosis not present

## 2022-02-03 DIAGNOSIS — M5136 Other intervertebral disc degeneration, lumbar region: Secondary | ICD-10-CM | POA: Diagnosis not present

## 2022-02-06 ENCOUNTER — Encounter (HOSPITAL_COMMUNITY): Payer: Self-pay | Admitting: Speech Pathology

## 2022-02-06 ENCOUNTER — Ambulatory Visit (HOSPITAL_COMMUNITY): Payer: 59 | Attending: Physician Assistant | Admitting: Speech Pathology

## 2022-02-06 ENCOUNTER — Ambulatory Visit (HOSPITAL_COMMUNITY)
Admission: RE | Admit: 2022-02-06 | Discharge: 2022-02-06 | Disposition: A | Discharge status: Home / Self Care | Payer: 59 | Source: Ambulatory Visit | Attending: Physician Assistant | Admitting: Physician Assistant

## 2022-02-06 ENCOUNTER — Other Ambulatory Visit: Payer: Self-pay

## 2022-02-06 DIAGNOSIS — R1312 Dysphagia, oropharyngeal phase: Secondary | ICD-10-CM | POA: Diagnosis not present

## 2022-02-06 NOTE — Therapy (Signed)
[facility] ?[address] ?[place], [place], [address] ?Phone: [phone]   Fax:  [phone] ? ?Modified Barium Swallow ? ?Patient Details  ?Name: Alexis Webb ?MRN: [id number] ?Date of Birth: 22-Mar-1966 ?No data recorded ? ?Encounter Date: 02/06/2022 ? ? End of Session - 02/06/22 1256   ? ? Visit Number 1   ? Number of Visits 1   ? Authorization Type Aetna   ? SLP Start Time 1142   ? SLP Stop Time  1210   ? SLP Time Calculation (min) 28 min   ? Activity Tolerance Patient tolerated treatment well   ? ?  ?  ? ?  ? ? ?Past Medical History:  ?Diagnosis Date  ? Anemia   ? years ago per pt  ? Cancer [facility]) 2002  ? left breast  ? Fatty liver   ? ? ?Past Surgical History:  ?Procedure Laterality Date  ? ANTERIOR CERVICAL DECOMP/DISCECTOMY FUSION N/A 10/13/2016  ? Procedure: C5-6, C6-7 Anterior Cervical Discectomy and Fusion, Allograft, Plate;  Surgeon: [name], MD;  Location: [facility];  Service: Orthopedics;  Laterality: N/A;  ? BREAST SURGERY    ? reconstructive breast surgery on left after mastectomy  ? MASTECTOMY Left 2002  ? TUBAL LIGATION    ? ? ?There were no vitals filed for this visit. ? ? Subjective Assessment - 02/06/22 1243   ? ? Subjective "I had this swelling on my right side (clavicle) about a month ago and had trouble swallowing."   ? Special Tests MBSS   ? Currently in Pain? No/denies   ? ?  ?  ? ?  ? ? ? ? ? ? General - 02/06/22 1246   ? ?  ? General Information  ? Date of Onset 01/20/22   ? HPI Alexis Webb is a 56 yo female who was referred for MBSS by Launa Grill, PA-C due to reports of swallowing difficulty about a month ago when she also noted right sided swelling near her clavicle. She reports that she had trouble swallowing solids and liquids, but has since resolved. She reports frequent throat clearing and waking up at night, coughing. She had an ultrasound of her neck which was unremarkable, no CT neck on record.   ? Type of Study MBS-Modified Barium  Swallow Study   ? Previous Swallow Assessment N/A   ? Diet Prior to this Study Regular;Thin liquids   ? Temperature Spikes Noted No   ? Respiratory Status Room air   ? History of Recent Intubation No   ? Behavior/Cognition Alert;Cooperative;Pleasant mood   ? Oral Cavity Assessment Within Functional Limits   ? Oral Care Completed by SLP No   ? Oral Cavity - Dentition Adequate natural dentition   ? Vision Functional for self feeding   ? Self-Feeding Abilities Able to feed self   ? Patient Positioning Upright in chair   ? Baseline Vocal Quality Normal   ? Volitional Cough Strong   ? Volitional Swallow Able to elicit   ? Anatomy Within functional limits   evidence of previous c-spine fusion  ? Pharyngeal Secretions Not observed secondary MBS   ? ?  ?  ? ?  ? ? ? ? ? Oral Preparation/Oral Phase - 02/06/22 1251   ? ?  ? Oral Preparation/Oral Phase  ? Oral Phase Within functional limits   min piecemeal deglutition with puree and solids  ?  ? Electrical stimulation - Oral Phase  ? Was Printmaker  Used No   ? ?  ?  ? ?  ? ? ? Pharyngeal Phase - 02/06/22 1252   ? ?  ? Pharyngeal Phase  ? Pharyngeal Phase Impaired   ?  ? Pharyngeal - Thin  ? Pharyngeal- Thin Teaspoon Swallow initiation at vallecula;Within functional limits   ? Pharyngeal- Thin Cup Swallow initiation at vallecula;Lateral channel residue   ? Pharyngeal- Thin Straw Within functional limits   ?  ? Pharyngeal - Solids  ? Pharyngeal- Puree Delayed swallow initiation;Within functional limits   ? Pharyngeal- Regular Within functional limits;Delayed swallow initiation-vallecula   ? Pharyngeal- Pill Within functional limits   ?  ? Electrical Stimulation - Pharyngeal Phase  ? Was Electrical Stimulation Used No   ? ?  ?  ? ?  ? ? ? Cricopharyngeal Phase - 02/06/22 1253   ? ?  ? Cervical Esophageal Phase  ? Cervical Esophageal Phase Impaired   ?  ? Cervical Esophageal Phase - Solids  ? Puree Esophageal backflow into cervical esophagus   ? Pill Other (Comment)    stasis in mid esophagus, eventually cleared with puree and liquid wash  ?  ? Cervical Esophageal Phase - Comment  ? Other Esophageal Phase Observations Barium was not completely cleared with primary wave, tertiary waves noted with backflow of puree and thins to cervical esophagus   ? ?  ?  ? ?  ? ? ? ? ? Plan - 02/06/22 1257   ? ? Clinical Impression Statement Pt presents with normal oropharyngeal swallow with swallow trigger at the level of the valleculae, occasional trace vallecular residue after the primary swallow which clears after spontaneous secondary swallow, no penetration/aspiration or significant residuals post swallow. Pt with frequent throat clearing throughout the exam, but no clinical correlate observed, except for possible primary esophageal component. Esophageal sweep revealed incomplete clearance of barium with the primary wave and tertiary contractions noted with backflow of bolus up to the level of the cervical esophagus. Barium tablet only cleared after puree wash and liquid wash. The imaging from the test was reviewed with Pt and she was provided written information regarding esophageal swallowing precautions and recommendations. Pt reports frequent throat clearing and night waking with coughing, which may be due to GERD. No further SLP services indicated at this time. Pt should follow up with her PCP regarding reflux possible esophageal dysmotility concerns. Pt is in agreement with plan of care and was appreciative of recommendations.   ? Consulted and Agree with Plan of Care Patient   ? ?  ?  ? ?  ? ? ?Patient will benefit from skilled therapeutic intervention in order to improve the following deficits and impairments:   ?Oropharyngeal dysphagia ? ? ? ? Recommendations/Treatment - 02/06/22 1255   ? ?  ? Swallow Evaluation Recommendations  ? Recommended Consults Consider esophageal assessment   ? SLP Diet Recommendations Age appropriate regular;Thin   ? Liquid Administration via Cup;Straw   ?  Medication Administration Whole meds with liquid   ? Supervision Patient able to self feed   ? Compensations Multiple dry swallows after each bite/sip   ? Postural Changes Seated upright at 90 degrees;Remain upright for at least 30 minutes after feeds/meals   ? ?  ?  ? ?  ? ? ? Prognosis - 02/06/22 1256   ? ?  ? Prognosis  ? Prognosis for Safe Diet Advancement Good   ? Barriers/Prognosis Comment esophageal component   ?  ? Individuals Consulted  ? Consulted and  Agree with Results and Recommendations Patient   ? Report Sent to  Referring physician   ? ?  ?  ? ?  ? ? ?Problem List ?Patient Active Problem List  ? Diagnosis Date Noted  ? Bilateral primary osteoarthritis of knee 11/03/2021  ? DCIS (ductal carcinoma in situ) 09/12/2021  ? Cervical spinal stenosis 10/13/2016  ? ?Thank you, ? ?Genene Churn, [place] ?[phone] ? ?Meika Earll, CCC-SLP ?02/06/2022, 1:05 PM ? ?[facility] ?[address] ?[place], [place], [address] ?Phone: [phone]   Fax:  [phone] ? ?Name: Alexis Webb ?MRN: [id number] ?Date of Birth: 22-Aug-1966 ? ?

## 2022-02-24 DIAGNOSIS — M5136 Other intervertebral disc degeneration, lumbar region: Secondary | ICD-10-CM | POA: Diagnosis not present

## 2022-02-24 DIAGNOSIS — M5127 Other intervertebral disc displacement, lumbosacral region: Secondary | ICD-10-CM | POA: Diagnosis not present

## 2022-03-17 DIAGNOSIS — M1711 Unilateral primary osteoarthritis, right knee: Secondary | ICD-10-CM | POA: Diagnosis not present

## 2022-03-17 DIAGNOSIS — Z6839 Body mass index (BMI) 39.0-39.9, adult: Secondary | ICD-10-CM | POA: Diagnosis not present

## 2022-03-22 DIAGNOSIS — M5416 Radiculopathy, lumbar region: Secondary | ICD-10-CM | POA: Diagnosis not present

## 2022-03-31 DIAGNOSIS — I10 Essential (primary) hypertension: Secondary | ICD-10-CM | POA: Diagnosis not present

## 2022-03-31 DIAGNOSIS — M1712 Unilateral primary osteoarthritis, left knee: Secondary | ICD-10-CM | POA: Diagnosis not present

## 2022-03-31 DIAGNOSIS — Z6839 Body mass index (BMI) 39.0-39.9, adult: Secondary | ICD-10-CM | POA: Diagnosis not present

## 2022-04-07 DIAGNOSIS — M1712 Unilateral primary osteoarthritis, left knee: Secondary | ICD-10-CM | POA: Diagnosis not present

## 2022-04-07 DIAGNOSIS — M1711 Unilateral primary osteoarthritis, right knee: Secondary | ICD-10-CM | POA: Diagnosis not present

## 2022-04-07 DIAGNOSIS — E785 Hyperlipidemia, unspecified: Secondary | ICD-10-CM | POA: Diagnosis not present

## 2022-04-21 ENCOUNTER — Ambulatory Visit (HOSPITAL_COMMUNITY)
Admission: RE | Admit: 2022-04-21 | Discharge: 2022-04-21 | Disposition: A | Discharge status: Home / Self Care | Payer: 59 | Source: Ambulatory Visit | Attending: Physician Assistant | Admitting: Physician Assistant

## 2022-04-21 DIAGNOSIS — M5136 Other intervertebral disc degeneration, lumbar region: Secondary | ICD-10-CM | POA: Diagnosis not present

## 2022-04-21 DIAGNOSIS — M5416 Radiculopathy, lumbar region: Secondary | ICD-10-CM | POA: Diagnosis not present

## 2022-04-21 DIAGNOSIS — M810 Age-related osteoporosis without current pathological fracture: Secondary | ICD-10-CM | POA: Diagnosis not present

## 2022-04-21 DIAGNOSIS — Z780 Asymptomatic menopausal state: Secondary | ICD-10-CM | POA: Diagnosis not present

## 2022-04-21 DIAGNOSIS — M85852 Other specified disorders of bone density and structure, left thigh: Secondary | ICD-10-CM | POA: Diagnosis not present

## 2022-04-24 ENCOUNTER — Inpatient Hospital Stay (HOSPITAL_COMMUNITY): Payer: 59 | Attending: Hematology

## 2022-04-24 ENCOUNTER — Ambulatory Visit (HOSPITAL_COMMUNITY)
Admission: RE | Admit: 2022-04-24 | Discharge: 2022-04-24 | Disposition: A | Discharge status: Home / Self Care | Payer: 59 | Source: Ambulatory Visit | Attending: Hematology | Admitting: Hematology

## 2022-04-24 DIAGNOSIS — Z803 Family history of malignant neoplasm of breast: Secondary | ICD-10-CM | POA: Insufficient documentation

## 2022-04-24 DIAGNOSIS — Z9221 Personal history of antineoplastic chemotherapy: Secondary | ICD-10-CM | POA: Insufficient documentation

## 2022-04-24 DIAGNOSIS — Z853 Personal history of malignant neoplasm of breast: Secondary | ICD-10-CM | POA: Insufficient documentation

## 2022-04-24 DIAGNOSIS — Z9012 Acquired absence of left breast and nipple: Secondary | ICD-10-CM | POA: Insufficient documentation

## 2022-04-24 DIAGNOSIS — Z79811 Long term (current) use of aromatase inhibitors: Secondary | ICD-10-CM | POA: Diagnosis not present

## 2022-04-24 DIAGNOSIS — Z800 Family history of malignant neoplasm of digestive organs: Secondary | ICD-10-CM | POA: Diagnosis not present

## 2022-04-24 DIAGNOSIS — Z8042 Family history of malignant neoplasm of prostate: Secondary | ICD-10-CM | POA: Diagnosis not present

## 2022-04-24 DIAGNOSIS — Z8051 Family history of malignant neoplasm of kidney: Secondary | ICD-10-CM | POA: Insufficient documentation

## 2022-04-24 DIAGNOSIS — Z923 Personal history of irradiation: Secondary | ICD-10-CM | POA: Diagnosis not present

## 2022-04-24 DIAGNOSIS — M8580 Other specified disorders of bone density and structure, unspecified site: Secondary | ICD-10-CM | POA: Insufficient documentation

## 2022-04-24 DIAGNOSIS — Z1231 Encounter for screening mammogram for malignant neoplasm of breast: Secondary | ICD-10-CM | POA: Insufficient documentation

## 2022-04-24 DIAGNOSIS — Z170 Estrogen receptor positive status [ER+]: Secondary | ICD-10-CM | POA: Diagnosis not present

## 2022-04-24 DIAGNOSIS — D0511 Intraductal carcinoma in situ of right breast: Secondary | ICD-10-CM | POA: Insufficient documentation

## 2022-04-24 DIAGNOSIS — R748 Abnormal levels of other serum enzymes: Secondary | ICD-10-CM | POA: Diagnosis not present

## 2022-04-24 LAB — CBC WITH DIFFERENTIAL/PLATELET
Abs Immature Granulocytes: 0.03 10*3/uL (ref 0.00–0.07)
Basophils Absolute: 0 10*3/uL (ref 0.0–0.1)
Basophils Relative: 0 %
Eosinophils Absolute: 0.1 10*3/uL (ref 0.0–0.5)
Eosinophils Relative: 1 %
HCT: 41.1 % (ref 36.0–46.0)
Hemoglobin: 12.4 g/dL (ref 12.0–15.0)
Immature Granulocytes: 0 %
Lymphocytes Relative: 20 %
Lymphs Abs: 1.7 10*3/uL (ref 0.7–4.0)
MCH: 28 pg (ref 26.0–34.0)
MCHC: 30.2 g/dL (ref 30.0–36.0)
MCV: 92.8 fL (ref 80.0–100.0)
Monocytes Absolute: 0.4 10*3/uL (ref 0.1–1.0)
Monocytes Relative: 5 %
Neutro Abs: 6.4 10*3/uL (ref 1.7–7.7)
Neutrophils Relative %: 74 %
Platelets: 206 10*3/uL (ref 150–400)
RBC: 4.43 MIL/uL (ref 3.87–5.11)
RDW: 15 % (ref 11.5–15.5)
WBC: 8.5 10*3/uL (ref 4.0–10.5)
nRBC: 0 % (ref 0.0–0.2)

## 2022-04-24 LAB — COMPREHENSIVE METABOLIC PANEL
ALT: 12 U/L (ref 0–44)
AST: 18 U/L (ref 15–41)
Albumin: 3.8 g/dL (ref 3.5–5.0)
Alkaline Phosphatase: 148 U/L — ABNORMAL HIGH (ref 38–126)
Anion gap: 5 (ref 5–15)
BUN: 15 mg/dL (ref 6–20)
CO2: 28 mmol/L (ref 22–32)
Calcium: 8.9 mg/dL (ref 8.9–10.3)
Chloride: 107 mmol/L (ref 98–111)
Creatinine, Ser: 0.74 mg/dL (ref 0.44–1.00)
GFR, Estimated: >60 mL/min (ref >60)
Glucose, Bld: 118 mg/dL — ABNORMAL HIGH (ref 70–99)
Potassium: 3.7 mmol/L (ref 3.5–5.1)
Sodium: 140 mmol/L (ref 135–145)
Total Bilirubin: 0.4 mg/dL (ref 0.3–1.2)
Total Protein: 7.9 g/dL (ref 6.5–8.1)

## 2022-04-24 LAB — VITAMIN D 25 HYDROXY (VIT D DEFICIENCY, FRACTURES): Vit D, 25-Hydroxy: 18.17 ng/mL — ABNORMAL LOW (ref 30–100)

## 2022-04-25 ENCOUNTER — Other Ambulatory Visit (HOSPITAL_COMMUNITY): Payer: 59

## 2022-04-25 ENCOUNTER — Encounter (HOSPITAL_COMMUNITY): Payer: 59

## 2022-05-02 ENCOUNTER — Inpatient Hospital Stay (HOSPITAL_COMMUNITY): Payer: 59 | Admitting: Hematology

## 2022-05-02 VITALS — BP 139/89 | HR 92 | Temp 97.4°F | Resp 18 | Ht 65.0 in | Wt 233.5 lb

## 2022-05-02 DIAGNOSIS — Z800 Family history of malignant neoplasm of digestive organs: Secondary | ICD-10-CM | POA: Diagnosis not present

## 2022-05-02 DIAGNOSIS — Z9012 Acquired absence of left breast and nipple: Secondary | ICD-10-CM | POA: Diagnosis not present

## 2022-05-02 DIAGNOSIS — D0511 Intraductal carcinoma in situ of right breast: Secondary | ICD-10-CM | POA: Diagnosis not present

## 2022-05-02 DIAGNOSIS — Z1231 Encounter for screening mammogram for malignant neoplasm of breast: Secondary | ICD-10-CM | POA: Diagnosis not present

## 2022-05-02 DIAGNOSIS — Z853 Personal history of malignant neoplasm of breast: Secondary | ICD-10-CM | POA: Diagnosis not present

## 2022-05-02 DIAGNOSIS — Z79811 Long term (current) use of aromatase inhibitors: Secondary | ICD-10-CM | POA: Diagnosis not present

## 2022-05-02 DIAGNOSIS — Z9221 Personal history of antineoplastic chemotherapy: Secondary | ICD-10-CM | POA: Diagnosis not present

## 2022-05-02 DIAGNOSIS — Z170 Estrogen receptor positive status [ER+]: Secondary | ICD-10-CM | POA: Diagnosis not present

## 2022-05-02 DIAGNOSIS — Z8042 Family history of malignant neoplasm of prostate: Secondary | ICD-10-CM | POA: Diagnosis not present

## 2022-05-02 DIAGNOSIS — Z803 Family history of malignant neoplasm of breast: Secondary | ICD-10-CM | POA: Diagnosis not present

## 2022-05-02 DIAGNOSIS — Z8051 Family history of malignant neoplasm of kidney: Secondary | ICD-10-CM | POA: Diagnosis not present

## 2022-05-02 DIAGNOSIS — Z923 Personal history of irradiation: Secondary | ICD-10-CM | POA: Diagnosis not present

## 2022-05-02 DIAGNOSIS — R748 Abnormal levels of other serum enzymes: Secondary | ICD-10-CM | POA: Diagnosis not present

## 2022-05-02 DIAGNOSIS — M8580 Other specified disorders of bone density and structure, unspecified site: Secondary | ICD-10-CM | POA: Diagnosis not present

## 2022-05-02 MED ORDER — ERGOCALCIFEROL 1.25 MG (50000 UT) PO CAPS: 50000.0000 [IU] | ORAL_CAPSULE | ORAL | 0 refills | Status: DC

## 2022-05-02 NOTE — Progress Notes (Signed)
[facility] [address], [place] [address]   Patient Care Team: Practice, [facility] as PCP - General Derek Jack, MD as Medical Oncologist (Oncology)  SUMMARY OF ONCOLOGIC HISTORY: Oncology History  DCIS (ductal carcinoma in situ)  05/14/2018 Cancer Staging   Staging form: Breast, AJCC 8th Edition - Clinical stage from 05/14/2018: Stage 0 (cTis (DCIS), cN0, cM0, ER+, PR+, HER2-) - Signed by Derek Jack, MD on 09/12/2021 Nuclear grade: G1    09/12/2021 Initial Diagnosis   DCIS (ductal carcinoma in situ)      CHIEF COMPLIANT: Follow-up of left breast cancer and right breast DCIS   INTERVAL HISTORY: Ms. Alexis Webb is a 56 y.o. female here today for follow up of her left breast cancer and right breast DCIS. Her last visit was on 09/12/2021.   Today she reports feeling good. She has lost 5 lbs since her last visit. Her joint pains are stable, and she denies hot flashes. She continues to take 2000 units of vitamin daily. She takes calcium irregularly as she reports they cause constipation.   REVIEW OF SYSTEMS:   Review of Systems  Constitutional:  Negative for appetite change and fatigue.  Respiratory:  Positive for shortness of breath.   Endocrine: Negative for hot flashes.  Musculoskeletal:  Positive for arthralgias (6/10 R knee - stable) and back pain (6/10).  Psychiatric/Behavioral:  The patient is nervous/anxious.   All other systems reviewed and are negative.  I have reviewed the past medical history, past surgical history, social history and family history with the patient and they are unchanged from previous note.   ALLERGIES:   has No Known Allergies.   MEDICATIONS:  Current Outpatient Medications  Medication Sig Dispense Refill   acetaminophen (TYLENOL) 500 MG tablet Take 500 mg by mouth every 6 (six) hours as needed.     Cholecalciferol 50 MCG (2000 UT) TABS Take 1 tablet by mouth every evening.     gabapentin  (NEURONTIN) 300 MG capsule Take by mouth.     HYDROcodone-acetaminophen (NORCO) 7.5-325 MG tablet Take 1 tablet by mouth daily as needed.     letrozole (FEMARA) 2.5 MG tablet Take 1 tablet (2.5 mg total) by mouth daily. 90 tablet 3   meloxicam (MOBIC) 7.5 MG tablet Take 1-2 tablets (7.5-15 mg total) by mouth daily. 30 tablet 0   oxyCODONE-acetaminophen (PERCOCET/ROXICET) 5-325 MG tablet Take 1-2 tablets by mouth every 6 (six) hours as needed for moderate pain. (Patient not taking: Reported on 11/03/2021) 40 tablet 0   No current facility-administered medications for this visit.     PHYSICAL EXAMINATION: Performance status (ECOG): 0 - Asymptomatic  There were no vitals filed for this visit. Wt Readings from Last 3 Encounters:  11/03/21 239 lb (108.4 kg)  10/27/21 239 lb (108.4 kg)  09/12/21 238 lb 3.2 oz (108 kg)   Physical Exam Vitals reviewed.  Constitutional:      Appearance: Normal appearance. She is obese.  Cardiovascular:     Rate and Rhythm: Normal rate and regular rhythm.     Pulses: Normal pulses.     Heart sounds: Normal heart sounds.  Pulmonary:     Effort: Pulmonary effort is normal.     Breath sounds: Normal breath sounds.  Chest:  Breasts:    Right: No swelling, bleeding, mass, skin change (lumpectomy scar WNL) or tenderness.     Left: No swelling, bleeding, mass, skin change (implant WNL) or tenderness.  Neurological:  General: No focal deficit present.     Mental Status: She is alert and oriented to person, place, and time.  Psychiatric:        Mood and Affect: Mood normal.        Behavior: Behavior normal.    Breast Exam Chaperone: Thana Ates     LABORATORY DATA:  I have reviewed the data as listed    Latest Ref Rng & Units 04/24/2022   11:15 AM 09/12/2021    3:13 PM 10/14/2016    6:17 AM  CMP  Glucose 70 - 99 mg/dL 118   93   106    BUN 6 - 20 mg/dL '15   13   9    ' Creatinine 0.44 - 1.00 mg/dL 0.74   0.67   0.66    Sodium 135 - 145 mmol/L  140   140   143    Potassium 3.5 - 5.1 mmol/L 3.7   3.9   3.2    Chloride 98 - 111 mmol/L 107   105   108    CO2 22 - 32 mmol/L '28   27   28    ' Calcium 8.9 - 10.3 mg/dL 8.9   9.1   8.2    Total Protein 6.5 - 8.1 g/dL 7.9   7.8     Total Bilirubin 0.3 - 1.2 mg/dL 0.4   0.6     Alkaline Phos 38 - 126 U/L 148   137     AST 15 - 41 U/L 18   22     ALT 0 - 44 U/L 12   12      No results found for: HYI502 Lab Results  Component Value Date   WBC 8.5 04/24/2022   HGB 12.4 04/24/2022   HCT 41.1 04/24/2022   MCV 92.8 04/24/2022   PLT 206 04/24/2022   NEUTROABS 6.4 04/24/2022    ASSESSMENT:  Right breast DCIS: - Right breast lumpectomy and SLNB on 05/14/2018 - Pathology shows DCIS.  No evidence of invasive carcinoma.  Margins negative.  0/3 lymph nodes involved.  ER 100%/PR 100%.  HER2 by FISH negative. - She was evaluated by Dr. Conley Simmonds at [facility].  Patient was referred to [place] as her insurance is no longer accepted there. - Femara 2.5 mg daily started on 07/09/2018. - XRT to the right breast from 07/15/2018 through 08/06/2018.  2.  Left breast invasive carcinoma: - Reportedly diagnosed in 2002.  I do not have records to review. - He underwent a lumpectomy followed by mastectomy, followed by implant.  She received AC followed by T chemotherapy.  This was followed by radiation therapy.  No adjuvant antiestrogen therapy was given.  3. 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  She is non-smoker. - Father had kidney cancer.  Paternal uncle had "stomach cancer".  Paternal aunt had breast cancer in her 26s.  Another paternal uncle had prostate cancer.  Another paternal uncle had pancreatic cancer.  Paternal grandmother also had pancreatic cancer.  Paternal first cousin had breast cancer.   PLAN:  Right breast DCIS: - Physical examination today shows right lumpectomy scar around the areola is within normal limits.  There is tenderness in the medial  part of the breast with no clearly palpable mass.  Left breast implant is within normal limits. - Reviewed labs which showed elevated alkaline phosphatase.  Further review showed that she had alkaline phosphatase elevated for several years  and stable.  CBC was within normal limits. - Elevated alkaline phosphatase most likely from fatty liver. - Right breast mammogram on 04/24/2022 was BI-RADS Category 2. - RTC 6 months for follow-up with repeat labs.  2.  Bone health: - She is taking vitamin D 2000 units daily.  She cannot take calcium pills due to constipation. - DEXA scan on 04/21/2022 reviewed by me shows T score -2.0, osteopenia. - Vitamin D level is 18.  Will prescribe vitamin D 50,000 units weekly for 12 weeks.  After that she was told to start taking vitamin D 4000 units daily.  3.  High risk family and personal history: - Genetic testing was done on 04/23/2018 at [facility].  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  Breast Cancer therapy associated bone loss: I have recommended calcium, Vitamin D and weight bearing exercises.  Orders placed this encounter:  No orders of the defined types were placed in this encounter.   The patient has a good understanding of the overall plan. She agrees with it. She will call with any problems that may develop before the next visit here.  Derek Jack, MD [facility] [phone]   I, Thana Ates, am acting as a scribe for Dr. Derek Jack.  I, Derek Jack MD, have reviewed the above documentation for accuracy and completeness, and I agree with the above.

## 2022-05-02 NOTE — Patient Instructions (Addendum)
[facility] at [facility] Discharge Instructions   You were seen and examined today by Dr. Delton Coombes.  Dr. Delton Coombes discussed your most recent lab work and mammogram which revealed that your Vitamin D levels are low and your alkaline phosphate ( a liver number) is high. The high liver number could be coming from fatty liver as this is the most common cause. Dr. Delton Coombes sent in Vitamin D 50,000 units for you to take once weekly for three months and then start taking Vitamin D 4,000 units over the counter once daily.  Follow-up as scheduled in 6 months.   Thank you for choosing [facility] at [facility] to provide your oncology and hematology care.  To afford each patient quality time with our provider, please arrive at least 15 minutes before your scheduled appointment time.   If you have a lab appointment with the [facility] please come in thru the Main Entrance and check in at the main information desk.  You need to re-schedule your appointment should you arrive 10 or more minutes late.  We strive to give you quality time with our providers, and arriving late affects you and other patients whose appointments are after yours.  Also, if you no show three or more times for appointments you may be dismissed from the clinic at the providers discretion.     Again, thank you for choosing [facility].  Our hope is that these requests will decrease the amount of time that you wait before being seen by our physicians.       _____________________________________________________________  Should you have questions after your visit to [facility], please contact our office at [phone] and follow the prompts.  Our office hours are 8:00 a.m. and 4:30 p.m. Monday - Friday.  Please note that voicemails left after 4:00 p.m. may not be returned until the following business day.  We are closed weekends and major holidays.  You do  have access to a nurse 24-7, just call the main number to the clinic [phone] and do not press any options, hold on the line and a nurse will answer the phone.    For prescription refill requests, have your pharmacy contact our office and allow 72 hours.    Due to Covid, you will need to wear a mask upon entering the hospital. If you do not have a mask, a mask will be given to you at the Main Entrance upon arrival. For doctor visits, patients may have 1 support person age 64 or older with them. For treatment visits, patients can not have anyone with them due to social distancing guidelines and our immunocompromised population.

## 2022-07-04 DIAGNOSIS — M5416 Radiculopathy, lumbar region: Secondary | ICD-10-CM | POA: Diagnosis not present

## 2022-07-24 DIAGNOSIS — M1712 Unilateral primary osteoarthritis, left knee: Secondary | ICD-10-CM | POA: Diagnosis not present

## 2022-07-24 DIAGNOSIS — Z6838 Body mass index (BMI) 38.0-38.9, adult: Secondary | ICD-10-CM | POA: Diagnosis not present

## 2022-07-24 DIAGNOSIS — M549 Dorsalgia, unspecified: Secondary | ICD-10-CM | POA: Diagnosis not present

## 2022-07-24 DIAGNOSIS — R030 Elevated blood-pressure reading, without diagnosis of hypertension: Secondary | ICD-10-CM | POA: Diagnosis not present

## 2022-08-16 DIAGNOSIS — Z9012 Acquired absence of left breast and nipple: Secondary | ICD-10-CM | POA: Diagnosis not present

## 2022-08-16 DIAGNOSIS — Z853 Personal history of malignant neoplasm of breast: Secondary | ICD-10-CM | POA: Diagnosis not present

## 2022-08-16 DIAGNOSIS — Z9889 Other specified postprocedural states: Secondary | ICD-10-CM | POA: Diagnosis not present

## 2022-08-16 DIAGNOSIS — Z923 Personal history of irradiation: Secondary | ICD-10-CM | POA: Diagnosis not present

## 2022-09-08 DIAGNOSIS — C50911 Malignant neoplasm of unspecified site of right female breast: Secondary | ICD-10-CM | POA: Diagnosis not present

## 2022-09-08 DIAGNOSIS — M79621 Pain in right upper arm: Secondary | ICD-10-CM | POA: Diagnosis not present

## 2022-09-08 DIAGNOSIS — R030 Elevated blood-pressure reading, without diagnosis of hypertension: Secondary | ICD-10-CM | POA: Diagnosis not present

## 2022-09-11 ENCOUNTER — Other Ambulatory Visit (HOSPITAL_COMMUNITY): Payer: Self-pay

## 2022-09-11 DIAGNOSIS — M79621 Pain in right upper arm: Secondary | ICD-10-CM

## 2022-09-21 ENCOUNTER — Ambulatory Visit (HOSPITAL_COMMUNITY)
Admission: RE | Admit: 2022-09-21 | Discharge: 2022-09-21 | Disposition: A | Discharge status: Home / Self Care | Payer: 59 | Source: Ambulatory Visit

## 2022-09-21 DIAGNOSIS — Z853 Personal history of malignant neoplasm of breast: Secondary | ICD-10-CM | POA: Diagnosis not present

## 2022-09-21 DIAGNOSIS — M79621 Pain in right upper arm: Secondary | ICD-10-CM

## 2022-11-03 DIAGNOSIS — Z853 Personal history of malignant neoplasm of breast: Secondary | ICD-10-CM | POA: Diagnosis not present

## 2022-11-03 DIAGNOSIS — M5416 Radiculopathy, lumbar region: Secondary | ICD-10-CM | POA: Diagnosis not present

## 2022-11-03 DIAGNOSIS — Z6839 Body mass index (BMI) 39.0-39.9, adult: Secondary | ICD-10-CM | POA: Diagnosis not present

## 2022-11-03 DIAGNOSIS — Z9012 Acquired absence of left breast and nipple: Secondary | ICD-10-CM | POA: Diagnosis not present

## 2022-11-03 DIAGNOSIS — Z9889 Other specified postprocedural states: Secondary | ICD-10-CM | POA: Diagnosis not present

## 2022-11-03 DIAGNOSIS — M5136 Other intervertebral disc degeneration, lumbar region: Secondary | ICD-10-CM | POA: Diagnosis not present

## 2022-11-03 DIAGNOSIS — Z923 Personal history of irradiation: Secondary | ICD-10-CM | POA: Diagnosis not present

## 2022-11-03 NOTE — H&P (Signed)
Subjective:     Patient ID: Alexis Webb is a 56 y.o. female.   HPI   Here for follow up discussion breast reconstruction. Diagnosed with left breast cancer 2002. No records available. Underwent lumpectomy followed by mastectomy. Received adjuvant chemotherapy and radiation.    Underwent delayed reconstruction with Dr. Towanda Malkin. No operative/hospital/office records available. Reports TRAM flap completed and complicated by flap loss and need for removal. Reports at least one surgery for "tissue expander" placement and reports this is still in place. Noted onset deflation approximately 02/2022. Prior to deflation reports significant asymmetry breasts. She has two implant cards. One is not dated and labeled mentor Silex Round Spectrum [place] [phone], no fill volume recorded. (This implant is no longer available in [place] 475 cc, range 400 cc- 570 cc diameter 14,5 cm- 14.2 Projection 4.5-5.7 cm [phone]). The second card has date either 2003 or 2005 (cannot read handwriting) [employer]or Smooth Round Spectrum Saline REF [phone], no fill volume recorded ( 575 ml implant, max fill 690 ml. Diameter 14.7 cm P 4.8 cm)   In 2019, diagnosed with right breast DCIS. Underwent lumpectomy SLN with final pathology 0.5 cm DCIS margins clear, 0/3 SLN, ER/PR+. Completed adjuvant radiation.   Follows with Dr. Delton Coombes. On Femara.   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   MMG 04/2022 normal.   Lives alone. Friends in area to assist with post op care. Not working.   Review of Systems  Constitutional: Positive for fatigue.  Musculoskeletal: Positive for arthralgias, back pain and joint swelling.    Remainder 12 point review negative    Objective:   Physical Exam Cardiovascular:     Rate and Rhythm: Normal rate and regular rhythm.     Heart sounds: Normal heart sounds.  Pulmonary:     Effort: Pulmonary effort is normal.     Breath sounds: Normal breath sounds.  Lymphadenopathy:     Upper Body:      Right upper body: No axillary adenopathy.     Left upper body: No axillary adenopathy.  Skin:    Comments: Fitzpatrick 6     Breasts: right grade 1 ptosis, periareolar scar from remote benign excision, transverss right lateral breast scar lumpectomy  Left breast absent with skin paddle TRAM located over lower pole chest, implant partially deflated No masses SN to nipple R 27  BW R 23 L CW 15 cm Nipple to IMF R 9  Remote port palpable left LOQ chest    Assessment:     History left breast cancer s/p lumpectomy followed by mastectomy adjuvant chemotherapy, radiation History right breast DCIS s/p lumpectomy SLN adjuvant radiation.      Plan:     Reviewed implant card show at least some exposure to textured implants. Reviewed risk ALCL with this, reviewed incidence of this reported with microtextured devices. Reviewed signs of ALCL including seroma. Reviewed examples of saline implants vs true tissue expanders. Reviewed purpose of texturing and examples of textured device.   Reviewed option removal left saline implant and no further reconstruction. Reviewed option referral to microsurgeon to discuss thigh or buttock donor sites- benefit of this would include no use of implant and implant associated problems. Patient declines both of these.   Reviewed increased risks reconstruction in setting of RT including wound healing problems and capsular contracture. In setting of implant based reconstruction wound healing problems can manifest as implant extrusion or exposure. Counseled some type of autologous tissue as part of reconstruction may reduce risks. Her  TRAM flap skin paddle is still in place so there is some remnant of this flap still in place. One option to remove ruptured implant and replace with implant or expander. Counseled given time since rupture cannot assure her soft tissue can accommodate implant and may require expander placement. If this is the case then additional surgery for  implant placement necessary.  Other option is to perform LD flap with TE or implant. Reviewed LD donor site scar and function. Patient declines LD flap. Counseled any complications from replacement implant or TE placement alone may require removal all devices and staring process over with LD flap or other autologous options.    Reviewed saline vs silicone implants. Both are not permanent devices and more surgery related to implants common. Reviewed shared risks rupture contracture infection requiring removal. Counseled silicone may offer less rippling but requires imaging surveillance for rupture.   Desires to proceed with saline implant replacement possible TE placement alone. Plan OP surgery. Drain teaching completed. Completed [place] saline physician patient checklist.   Additional risks including but not limited to bleeding, infection, seroma, hematoma, damage to adjacent structures, blood clots in legs or lungs, need for additional procedures, asymmetry, unacceptable cosmetic result.    Rx for oxycodone, Bactrim and robaxin given.

## 2022-11-06 ENCOUNTER — Encounter (HOSPITAL_BASED_OUTPATIENT_CLINIC_OR_DEPARTMENT_OTHER): Payer: Self-pay | Admitting: Plastic Surgery

## 2022-11-06 ENCOUNTER — Inpatient Hospital Stay: Payer: 59 | Attending: Hematology

## 2022-11-06 DIAGNOSIS — Z853 Personal history of malignant neoplasm of breast: Secondary | ICD-10-CM | POA: Insufficient documentation

## 2022-11-06 DIAGNOSIS — Z803 Family history of malignant neoplasm of breast: Secondary | ICD-10-CM | POA: Diagnosis not present

## 2022-11-06 DIAGNOSIS — M8580 Other specified disorders of bone density and structure, unspecified site: Secondary | ICD-10-CM | POA: Insufficient documentation

## 2022-11-06 DIAGNOSIS — M5416 Radiculopathy, lumbar region: Secondary | ICD-10-CM | POA: Diagnosis not present

## 2022-11-06 DIAGNOSIS — Z8051 Family history of malignant neoplasm of kidney: Secondary | ICD-10-CM | POA: Diagnosis not present

## 2022-11-06 DIAGNOSIS — Z809 Family history of malignant neoplasm, unspecified: Secondary | ICD-10-CM | POA: Diagnosis not present

## 2022-11-06 DIAGNOSIS — Z923 Personal history of irradiation: Secondary | ICD-10-CM | POA: Diagnosis not present

## 2022-11-06 DIAGNOSIS — Z9012 Acquired absence of left breast and nipple: Secondary | ICD-10-CM | POA: Diagnosis not present

## 2022-11-06 DIAGNOSIS — Z800 Family history of malignant neoplasm of digestive organs: Secondary | ICD-10-CM | POA: Diagnosis not present

## 2022-11-06 DIAGNOSIS — D0511 Intraductal carcinoma in situ of right breast: Secondary | ICD-10-CM | POA: Diagnosis not present

## 2022-11-06 DIAGNOSIS — Z8042 Family history of malignant neoplasm of prostate: Secondary | ICD-10-CM | POA: Diagnosis not present

## 2022-11-06 DIAGNOSIS — Z9221 Personal history of antineoplastic chemotherapy: Secondary | ICD-10-CM | POA: Diagnosis not present

## 2022-11-06 DIAGNOSIS — Z79811 Long term (current) use of aromatase inhibitors: Secondary | ICD-10-CM | POA: Diagnosis not present

## 2022-11-06 LAB — CBC WITH DIFFERENTIAL/PLATELET
Abs Immature Granulocytes: 0.04 10*3/uL (ref 0.00–0.07)
Basophils Absolute: 0 10*3/uL (ref 0.0–0.1)
Basophils Relative: 0 %
Eosinophils Absolute: 0.1 10*3/uL (ref 0.0–0.5)
Eosinophils Relative: 1 %
HCT: 37 % (ref 36.0–46.0)
Hemoglobin: 11.5 g/dL — ABNORMAL LOW (ref 12.0–15.0)
Immature Granulocytes: 0 %
Lymphocytes Relative: 15 %
Lymphs Abs: 1.3 10*3/uL (ref 0.7–4.0)
MCH: 28.3 pg (ref 26.0–34.0)
MCHC: 31.1 g/dL (ref 30.0–36.0)
MCV: 90.9 fL (ref 80.0–100.0)
Monocytes Absolute: 0.3 10*3/uL (ref 0.1–1.0)
Monocytes Relative: 3 %
Neutro Abs: 7.3 10*3/uL (ref 1.7–7.7)
Neutrophils Relative %: 81 %
Platelets: 210 10*3/uL (ref 150–400)
RBC: 4.07 MIL/uL (ref 3.87–5.11)
RDW: 14.2 % (ref 11.5–15.5)
WBC: 9 10*3/uL (ref 4.0–10.5)
nRBC: 0 % (ref 0.0–0.2)

## 2022-11-06 LAB — COMPREHENSIVE METABOLIC PANEL
ALT: 10 U/L (ref 0–44)
AST: 22 U/L (ref 15–41)
Albumin: 3.7 g/dL (ref 3.5–5.0)
Alkaline Phosphatase: 135 U/L — ABNORMAL HIGH (ref 38–126)
Anion gap: 9 (ref 5–15)
BUN: 13 mg/dL (ref 6–20)
CO2: 26 mmol/L (ref 22–32)
Calcium: 8.7 mg/dL — ABNORMAL LOW (ref 8.9–10.3)
Chloride: 104 mmol/L (ref 98–111)
Creatinine, Ser: 0.71 mg/dL (ref 0.44–1.00)
GFR, Estimated: >60 mL/min (ref >60)
Glucose, Bld: 153 mg/dL — ABNORMAL HIGH (ref 70–99)
Potassium: 3.5 mmol/L (ref 3.5–5.1)
Sodium: 139 mmol/L (ref 135–145)
Total Bilirubin: 0.5 mg/dL (ref 0.3–1.2)
Total Protein: 7.6 g/dL (ref 6.5–8.1)

## 2022-11-06 LAB — VITAMIN D 25 HYDROXY (VIT D DEFICIENCY, FRACTURES): Vit D, 25-Hydroxy: 31.43 ng/mL (ref 30–100)

## 2022-11-06 NOTE — Progress Notes (Signed)

## 2022-11-10 ENCOUNTER — Encounter (HOSPITAL_BASED_OUTPATIENT_CLINIC_OR_DEPARTMENT_OTHER): Payer: Self-pay | Admitting: Plastic Surgery

## 2022-11-10 ENCOUNTER — Other Ambulatory Visit: Payer: Self-pay

## 2022-11-10 ENCOUNTER — Ambulatory Visit (HOSPITAL_BASED_OUTPATIENT_CLINIC_OR_DEPARTMENT_OTHER): Payer: 59 | Admitting: Anesthesiology

## 2022-11-10 ENCOUNTER — Encounter (HOSPITAL_BASED_OUTPATIENT_CLINIC_OR_DEPARTMENT_OTHER)
Admission: RE | Disposition: A | Discharge status: Home / Self Care | Payer: Self-pay | Source: Ambulatory Visit | Attending: Plastic Surgery

## 2022-11-10 ENCOUNTER — Ambulatory Visit (HOSPITAL_BASED_OUTPATIENT_CLINIC_OR_DEPARTMENT_OTHER)
Admission: RE | Admit: 2022-11-10 | Discharge: 2022-11-10 | Disposition: A | Discharge status: Home / Self Care | Payer: 59 | Source: Ambulatory Visit | Attending: Plastic Surgery | Admitting: Plastic Surgery

## 2022-11-10 DIAGNOSIS — Z9012 Acquired absence of left breast and nipple: Secondary | ICD-10-CM | POA: Diagnosis not present

## 2022-11-10 DIAGNOSIS — Z6839 Body mass index (BMI) 39.0-39.9, adult: Secondary | ICD-10-CM | POA: Diagnosis not present

## 2022-11-10 DIAGNOSIS — Z9010 Acquired absence of unspecified breast and nipple: Secondary | ICD-10-CM | POA: Diagnosis not present

## 2022-11-10 DIAGNOSIS — N6012 Diffuse cystic mastopathy of left breast: Secondary | ICD-10-CM | POA: Diagnosis not present

## 2022-11-10 DIAGNOSIS — Y812 Prosthetic and other implants, materials and accessory general- and plastic-surgery devices associated with adverse incidents: Secondary | ICD-10-CM | POA: Insufficient documentation

## 2022-11-10 DIAGNOSIS — Z853 Personal history of malignant neoplasm of breast: Secondary | ICD-10-CM | POA: Diagnosis not present

## 2022-11-10 DIAGNOSIS — Z01818 Encounter for other preprocedural examination: Secondary | ICD-10-CM

## 2022-11-10 DIAGNOSIS — T8549XA Other mechanical complication of breast prosthesis and implant, initial encounter: Secondary | ICD-10-CM

## 2022-11-10 DIAGNOSIS — Z923 Personal history of irradiation: Secondary | ICD-10-CM | POA: Insufficient documentation

## 2022-11-10 DIAGNOSIS — T8459XA Infection and inflammatory reaction due to other internal joint prosthesis, initial encounter: Secondary | ICD-10-CM | POA: Diagnosis present

## 2022-11-10 HISTORY — DX: Nausea with vomiting, unspecified: Z98.890

## 2022-11-10 HISTORY — DX: Nausea with vomiting, unspecified: R11.2

## 2022-11-10 HISTORY — PX: TISSUE EXPANDER PLACEMENT: SHX2530

## 2022-11-10 HISTORY — PX: BREAST IMPLANT REMOVAL: SHX5361

## 2022-11-10 SURGERY — REMOVAL, IMPLANT, BREAST
Anesthesia: General | Site: Chest | Laterality: Left

## 2022-11-10 MED ORDER — CEFAZOLIN SODIUM-DEXTROSE 2-4 GM/100ML-% IV SOLN
2.0000 g | INTRAVENOUS | Status: AC
  Administered 2022-11-10: 2 g via INTRAVENOUS

## 2022-11-10 MED ORDER — CHLORHEXIDINE GLUCONATE CLOTH 2 % EX PADS: 6.0000 | MEDICATED_PAD | Freq: Once | CUTANEOUS | Status: DC

## 2022-11-10 MED ORDER — 0.9 % SODIUM CHLORIDE (POUR BTL) OPTIME
TOPICAL | Status: DC | PRN
  Administered 2022-11-10: 200 mL

## 2022-11-10 MED ORDER — MEPERIDINE HCL 25 MG/ML IJ SOLN: 6.2500 mg | INTRAMUSCULAR | Status: DC | PRN

## 2022-11-10 MED ORDER — GABAPENTIN 300 MG PO CAPS
ORAL_CAPSULE | ORAL | Status: AC
  Filled 2022-11-10: qty 1

## 2022-11-10 MED ORDER — LACTATED RINGERS IV SOLN: INTRAVENOUS | Status: DC

## 2022-11-10 MED ORDER — ACETAMINOPHEN 500 MG PO TABS
ORAL_TABLET | ORAL | Status: AC
  Filled 2022-11-10: qty 2

## 2022-11-10 MED ORDER — PHENYLEPHRINE HCL (PRESSORS) 10 MG/ML IV SOLN
INTRAVENOUS | Status: DC | PRN
  Administered 2022-11-10 (×3): 80 ug via INTRAVENOUS
  Administered 2022-11-10: 160 ug via INTRAVENOUS

## 2022-11-10 MED ORDER — MIDAZOLAM HCL 5 MG/5ML IJ SOLN
INTRAMUSCULAR | Status: DC | PRN
  Administered 2022-11-10: 2 mg via INTRAVENOUS

## 2022-11-10 MED ORDER — CELECOXIB 200 MG PO CAPS
200.0000 mg | ORAL_CAPSULE | ORAL | Status: AC
  Administered 2022-11-10: 200 mg via ORAL

## 2022-11-10 MED ORDER — ONDANSETRON HCL 4 MG/2ML IJ SOLN
INTRAMUSCULAR | Status: DC | PRN
  Administered 2022-11-10: 4 mg via INTRAVENOUS

## 2022-11-10 MED ORDER — PROPOFOL 10 MG/ML IV BOLUS
INTRAVENOUS | Status: AC
  Filled 2022-11-10: qty 20

## 2022-11-10 MED ORDER — PROPOFOL 10 MG/ML IV BOLUS
INTRAVENOUS | Status: DC | PRN
  Administered 2022-11-10: 20 mg via INTRAVENOUS
  Administered 2022-11-10: 150 mg via INTRAVENOUS
  Administered 2022-11-10: 20 mg via INTRAVENOUS

## 2022-11-10 MED ORDER — CELECOXIB 200 MG PO CAPS
ORAL_CAPSULE | ORAL | Status: AC
  Filled 2022-11-10: qty 1

## 2022-11-10 MED ORDER — MIDAZOLAM HCL 2 MG/2ML IJ SOLN
INTRAMUSCULAR | Status: AC
  Filled 2022-11-10: qty 2

## 2022-11-10 MED ORDER — PROMETHAZINE HCL 25 MG/ML IJ SOLN: 6.2500 mg | INTRAMUSCULAR | Status: DC | PRN

## 2022-11-10 MED ORDER — SODIUM CHLORIDE 0.9 % IV SOLN
INTRAVENOUS | Status: DC | PRN
  Administered 2022-11-10: 500 mL

## 2022-11-10 MED ORDER — BUPIVACAINE HCL (PF) 0.5 % IJ SOLN
INTRAMUSCULAR | Status: AC
  Filled 2022-11-10: qty 30

## 2022-11-10 MED ORDER — GABAPENTIN 300 MG PO CAPS
300.0000 mg | ORAL_CAPSULE | ORAL | Status: AC
  Administered 2022-11-10: 300 mg via ORAL

## 2022-11-10 MED ORDER — ROCURONIUM BROMIDE 100 MG/10ML IV SOLN
INTRAVENOUS | Status: DC | PRN
  Administered 2022-11-10: 60 mg via INTRAVENOUS

## 2022-11-10 MED ORDER — FENTANYL CITRATE (PF) 100 MCG/2ML IJ SOLN
INTRAMUSCULAR | Status: DC | PRN
  Administered 2022-11-10: 50 ug via INTRAVENOUS
  Administered 2022-11-10: 100 ug via INTRAVENOUS

## 2022-11-10 MED ORDER — SUGAMMADEX SODIUM 200 MG/2ML IV SOLN
INTRAVENOUS | Status: DC | PRN
  Administered 2022-11-10: 200 mg via INTRAVENOUS

## 2022-11-10 MED ORDER — POVIDONE-IODINE 10 % EX SOLN
CUTANEOUS | Status: DC | PRN
  Administered 2022-11-10: 1 via TOPICAL

## 2022-11-10 MED ORDER — CEFAZOLIN SODIUM-DEXTROSE 2-4 GM/100ML-% IV SOLN
INTRAVENOUS | Status: AC
  Filled 2022-11-10: qty 100

## 2022-11-10 MED ORDER — ROCURONIUM BROMIDE 10 MG/ML (PF) SYRINGE
PREFILLED_SYRINGE | INTRAVENOUS | Status: AC
  Filled 2022-11-10: qty 10

## 2022-11-10 MED ORDER — SODIUM CHLORIDE 0.9 % IV SOLN
INTRAVENOUS | Status: AC
  Filled 2022-11-10: qty 10

## 2022-11-10 MED ORDER — ACETAMINOPHEN 500 MG PO TABS
1000.0000 mg | ORAL_TABLET | ORAL | Status: AC
  Administered 2022-11-10: 1000 mg via ORAL

## 2022-11-10 MED ORDER — FENTANYL CITRATE (PF) 100 MCG/2ML IJ SOLN
INTRAMUSCULAR | Status: AC
  Filled 2022-11-10: qty 2

## 2022-11-10 MED ORDER — HYDROMORPHONE HCL 1 MG/ML IJ SOLN: 0.2500 mg | INTRAMUSCULAR | Status: DC | PRN

## 2022-11-10 MED ORDER — PHENYLEPHRINE 80 MCG/ML (10ML) SYRINGE FOR IV PUSH (FOR BLOOD PRESSURE SUPPORT)
PREFILLED_SYRINGE | INTRAVENOUS | Status: AC
  Filled 2022-11-10: qty 10

## 2022-11-10 MED ORDER — ACETAMINOPHEN 500 MG PO TABS: 1000.0000 mg | ORAL_TABLET | Freq: Once | ORAL | Status: AC

## 2022-11-10 MED ORDER — MIDAZOLAM HCL 2 MG/2ML IJ SOLN: 0.5000 mg | Freq: Once | INTRAMUSCULAR | Status: DC | PRN

## 2022-11-10 MED ORDER — SCOPOLAMINE 1 MG/3DAYS TD PT72
1.0000 | MEDICATED_PATCH | TRANSDERMAL | Status: DC
  Administered 2022-11-10: 1.5 mg via TRANSDERMAL

## 2022-11-10 MED ORDER — SCOPOLAMINE 1 MG/3DAYS TD PT72
MEDICATED_PATCH | TRANSDERMAL | Status: AC
  Filled 2022-11-10: qty 1

## 2022-11-10 MED ORDER — ONDANSETRON HCL 4 MG/2ML IJ SOLN
INTRAMUSCULAR | Status: AC
  Filled 2022-11-10: qty 2

## 2022-11-10 MED ORDER — LIDOCAINE HCL (CARDIAC) PF 100 MG/5ML IV SOSY
PREFILLED_SYRINGE | INTRAVENOUS | Status: DC | PRN
  Administered 2022-11-10: 40 mg via INTRAVENOUS

## 2022-11-10 MED ORDER — BUPIVACAINE HCL (PF) 0.5 % IJ SOLN
INTRAMUSCULAR | Status: DC | PRN
  Administered 2022-11-10: 30 mL

## 2022-11-10 MED ORDER — OXYCODONE HCL 5 MG/5ML PO SOLN: 5.0000 mg | Freq: Once | ORAL | Status: DC | PRN

## 2022-11-10 MED ORDER — LIDOCAINE 2% (20 MG/ML) 5 ML SYRINGE
INTRAMUSCULAR | Status: AC
  Filled 2022-11-10: qty 5

## 2022-11-10 MED ORDER — SODIUM CHLORIDE 0.9 % IV SOLN
INTRAVENOUS | Status: AC | PRN
  Administered 2022-11-10: 690 mL

## 2022-11-10 MED ORDER — DEXAMETHASONE SODIUM PHOSPHATE 4 MG/ML IJ SOLN
INTRAMUSCULAR | Status: DC | PRN
  Administered 2022-11-10: 10 mg via INTRAVENOUS

## 2022-11-10 MED ORDER — OXYCODONE HCL 5 MG PO TABS: 5.0000 mg | ORAL_TABLET | Freq: Once | ORAL | Status: DC | PRN

## 2022-11-10 MED ORDER — DEXAMETHASONE SODIUM PHOSPHATE 10 MG/ML IJ SOLN
INTRAMUSCULAR | Status: AC
  Filled 2022-11-10: qty 1

## 2022-11-10 SURGICAL SUPPLY — 80 items
ADH SKN CLS APL DERMABOND .7 (GAUZE/BANDAGES/DRESSINGS) ×1
APL PRP STRL LF DISP 70% ISPRP (MISCELLANEOUS) ×2
BAG DECANTER FOR FLEXI CONT (MISCELLANEOUS) ×1 IMPLANT
BINDER BREAST 3XL (GAUZE/BANDAGES/DRESSINGS) IMPLANT
BINDER BREAST LRG (GAUZE/BANDAGES/DRESSINGS) IMPLANT
BINDER BREAST MEDIUM (GAUZE/BANDAGES/DRESSINGS) IMPLANT
BINDER BREAST XLRG (GAUZE/BANDAGES/DRESSINGS) IMPLANT
BINDER BREAST XXLRG (GAUZE/BANDAGES/DRESSINGS) IMPLANT
BLADE SURG 10 STRL SS (BLADE) ×1 IMPLANT
BLADE SURG 15 STRL LF DISP TIS (BLADE) IMPLANT
BLADE SURG 15 STRL SS (BLADE) ×1
BNDG GAUZE DERMACEA FLUFF 4 (GAUZE/BANDAGES/DRESSINGS) IMPLANT
BNDG GZE DERMACEA 4 6PLY (GAUZE/BANDAGES/DRESSINGS) ×2
CANISTER SUCT 1200ML W/VALVE (MISCELLANEOUS) ×1 IMPLANT
CHLORAPREP W/TINT 26 (MISCELLANEOUS) ×1 IMPLANT
COVER BACK TABLE 60X90IN (DRAPES) ×1 IMPLANT
COVER MAYO STAND STRL (DRAPES) ×1 IMPLANT
DERMABOND ADVANCED .7 DNX12 (GAUZE/BANDAGES/DRESSINGS) ×1 IMPLANT
DRAIN CHANNEL 15F RND FF W/TCR (WOUND CARE) IMPLANT
DRAIN CHANNEL 19F RND (DRAIN) IMPLANT
DRAPE INCISE IOBAN 66X45 STRL (DRAPES) IMPLANT
DRAPE TOP ARMCOVERS (MISCELLANEOUS) ×1 IMPLANT
DRAPE U-SHAPE 76X120 STRL (DRAPES) ×1 IMPLANT
DRAPE UTILITY XL STRL (DRAPES) ×1 IMPLANT
ELECT BLADE 4.0 EZ CLEAN MEGAD (MISCELLANEOUS) ×1
ELECT BLADE 6.5 EXT (BLADE) IMPLANT
ELECT COATED BLADE 2.86 ST (ELECTRODE) ×1 IMPLANT
ELECT REM PT RETURN 9FT ADLT (ELECTROSURGICAL) ×1
ELECTRODE BLDE 4.0 EZ CLN MEGD (MISCELLANEOUS) ×1 IMPLANT
ELECTRODE REM PT RTRN 9FT ADLT (ELECTROSURGICAL) ×1 IMPLANT
EVACUATOR SILICONE 100CC (DRAIN) IMPLANT
FUNNEL KELLER 2 DISP (MISCELLANEOUS) IMPLANT
GAUZE PAD ABD 8X10 STRL (GAUZE/BANDAGES/DRESSINGS) ×1 IMPLANT
GAUZE SPONGE 4X4 12PLY STRL (GAUZE/BANDAGES/DRESSINGS) IMPLANT
GAUZE SPONGE 4X4 12PLY STRL LF (GAUZE/BANDAGES/DRESSINGS) IMPLANT
GLOVE BIO SURGEON STRL SZ 6 (GLOVE) ×2 IMPLANT
GLOVE BIOGEL PI IND STRL 6.5 (GLOVE) IMPLANT
GLOVE BIOGEL PI IND STRL 7.0 (GLOVE) IMPLANT
GLOVE ECLIPSE 6.5 STRL STRAW (GLOVE) IMPLANT
GOWN STRL REUS W/ TWL LRG LVL3 (GOWN DISPOSABLE) ×2 IMPLANT
GOWN STRL REUS W/TWL LRG LVL3 (GOWN DISPOSABLE) ×2
IMPL BREAST SALINE 650-700C (Breast) IMPLANT
IMPLANT BREAST SALINE 650-700C (Breast) ×1 IMPLANT
IV NS 500ML (IV SOLUTION)
IV NS 500ML BAXH (IV SOLUTION) ×1 IMPLANT
KIT FILL ASEPTIC TRANSFER (MISCELLANEOUS) IMPLANT
MARKER SKIN DUAL TIP RULER LAB (MISCELLANEOUS) IMPLANT
NDL HYPO 25X1 1.5 SAFETY (NEEDLE) ×1 IMPLANT
NEEDLE HYPO 25X1 1.5 SAFETY (NEEDLE) ×1 IMPLANT
NS IRRIG 1000ML POUR BTL (IV SOLUTION) IMPLANT
PACK BASIN DAY SURGERY FS (CUSTOM PROCEDURE TRAY) ×1 IMPLANT
PENCIL SMOKE EVACUATOR (MISCELLANEOUS) ×1 IMPLANT
PIN SAFETY STERILE (MISCELLANEOUS) IMPLANT
SHEET MEDIUM DRAPE 40X70 STRL (DRAPES) ×1 IMPLANT
SIZER BREAST SGL USE HP 650CC (SIZER) ×1
SIZER BRST SGL USE HP 650CC (SIZER) IMPLANT
SLEEVE SCD COMPRESS KNEE MED (STOCKING) ×1 IMPLANT
SPIKE FLUID TRANSFER (MISCELLANEOUS) IMPLANT
SPONGE T-LAP 18X18 ~~LOC~~+RFID (SPONGE) ×2 IMPLANT
STAPLER VISISTAT 35W (STAPLE) ×1 IMPLANT
STRIP CLOSURE SKIN 1/2X4 (GAUZE/BANDAGES/DRESSINGS) IMPLANT
SUCTION FRAZIER HANDLE 10FR (MISCELLANEOUS)
SUCTION TUBE FRAZIER 10FR DISP (MISCELLANEOUS) IMPLANT
SUT ETHILON 2 0 FS 18 (SUTURE) IMPLANT
SUT MNCRL AB 4-0 PS2 18 (SUTURE) ×1 IMPLANT
SUT PDS AB 2-0 CT2 27 (SUTURE) IMPLANT
SUT VIC AB 3-0 PS1 18 (SUTURE)
SUT VIC AB 3-0 PS1 18XBRD (SUTURE) IMPLANT
SUT VIC AB 3-0 SH 27 (SUTURE) ×1
SUT VIC AB 3-0 SH 27X BRD (SUTURE) IMPLANT
SUT VICRYL 4-0 PS2 18IN ABS (SUTURE) ×1 IMPLANT
SWAB COLLECTION DEVICE MRSA (MISCELLANEOUS) IMPLANT
SWAB CULTURE ESWAB REG 1ML (MISCELLANEOUS) IMPLANT
SYR 50ML LL SCALE MARK (SYRINGE) IMPLANT
SYR BULB IRRIG 60ML STRL (SYRINGE) ×2 IMPLANT
SYR CONTROL 10ML LL (SYRINGE) ×1 IMPLANT
TOWEL GREEN STERILE FF (TOWEL DISPOSABLE) ×2 IMPLANT
TUBE CONNECTING 20X1/4 (TUBING) ×2 IMPLANT
UNDERPAD 30X36 HEAVY ABSORB (UNDERPADS AND DIAPERS) ×2 IMPLANT
YANKAUER SUCT BULB TIP NO VENT (SUCTIONS) ×1 IMPLANT

## 2022-11-10 NOTE — Anesthesia Preprocedure Evaluation (Addendum)
Anesthesia Evaluation  Patient identified by MRN, date of birth, ID band Patient awake    Reviewed: Allergy & Precautions, NPO status , Patient's Chart, lab work & pertinent test results  History of Anesthesia Complications (+) PONV  Airway Mallampati: II  TM Distance: >3 FB Neck ROM: Full    Dental  (+) Teeth Intact, Dental Advisory Given   Pulmonary neg pulmonary ROS   breath sounds clear to auscultation       Cardiovascular negative cardio ROS  Rhythm:Regular Rate:Normal     Neuro/Psych negative neurological ROS     GI/Hepatic negative GI ROS, Neg liver ROS,,,  Endo/Other    Morbid obesityBMI 39  Renal/GU negative Renal ROS     Musculoskeletal   Abdominal  (+) + obese  Peds  Hematology negative hematology ROS (+)   Anesthesia Other Findings Breast cancer  Reproductive/Obstetrics                             Anesthesia Physical Anesthesia Plan  ASA: 2  Anesthesia Plan: General   Post-op Pain Management: Tylenol PO (pre-op)*   Induction: Intravenous  PONV Risk Score and Plan: 4 or greater and Ondansetron, Dexamethasone, Midazolam and Scopolamine patch - Pre-op  Airway Management Planned: Oral ETT  Additional Equipment: None  Intra-op Plan:   Post-operative Plan: Extubation in OR  Informed Consent: I have reviewed the patients History and Physical, chart, labs and discussed the procedure including the risks, benefits and alternatives for the proposed anesthesia with the patient or authorized representative who has indicated his/her understanding and acceptance.     Dental advisory given  Plan Discussed with: CRNA and Surgeon  Anesthesia Plan Comments:         Anesthesia Quick Evaluation

## 2022-11-10 NOTE — Op Note (Signed)
Operative Note   DATE OF OPERATION: 12.8.23  LOCATION: [facility]-outpatient  SURGICAL DIVISION: Plastic Surgery  PREOPERATIVE DIAGNOSES:  1. History breast cancer 2. Acquired absence breast 3. History therapeutic radiation 4. Mechanical complication breast implant  POSTOPERATIVE DIAGNOSES:  same  PROCEDURE:  1. Removal and replacement left chest saline implant. 2. Extensive capsulectomy left chest  SURGEON: Irene Limbo MD MBA  ASSISTANT: none  ANESTHESIA:  General.   EBL: 10 ml  COMPLICATIONS: None immediate.   INDICATIONS FOR PROCEDURE:  The patient, Alexis Webb, is a 56 y.o. female born on Dec 24, 1965, is here for treatment deflation left breast implant. Patient underwent delayed breast reconstruction with TRAM flap complicated by partial flap necrosis. She underwent subsequent reconstruction with placement adjustable saline implant.   FINDINGS: Removed deflated smooth round saline implant with remote port. No implant stamp/markings present. Natrelle Smooth Round High Profile Saline implant 650 ml placed, REF [id number] fill volume 690 ml. SN [id number]. This capsule and scar tissue noted associated with remnant TRAM flap over lower pole which was excised.  DESCRIPTION OF PROCEDURE:  The patient's operative site was marked with the patient in the preoperative area. The patient was taken to the operating room. SCDs were placed and IV antibiotics were given. The patient's operative site was prepped and draped in a sterile fashion. A time out was performed and all information was confirmed to be correct.  Incision made in prior scar lower outer inset of TRAM flap skin paddle. Incision carried through subcutaneous tissue and superficial fascia to implant cavity. A smooth round saline implant with remote port noted. Incision made over lateral chest wall in prior scar to access remote port. Implant, tubing and remote port removed. Thick scar tissue and capsule present over lower  pole associated undersurface of remnant TRAM flap. This was excised. Additional circumferential capsulotomies performed. Sizer placed. Sizer removed and cavity irrigated with saline solution containing Ancef, gentamicin, and bacitracin. Hemostasis ensured. Local anesthetic infiltrated. 19 Fr JP drain placed in cavity and secured with 2-0 nylon. Implant prepared and placed in cavity. Implant filled to 690 ml. Fill tubing removed. Fill tab closure ensured. Implant orientation ensured. Closure completed with 3-0 vicryl in superficial fascia, 4-0 vicryl in dermis and 4-0 monocryl skin closure. Remote port site closed with 4-0 vicryl in dermis and 4-0 monocryl skin closure subcuticular. Dermabond applied to incisions. Dry dressing and breast binder applied.   The patient was allowed to wake from anesthesia, extubated and taken to the recovery room in satisfactory condition.   SPECIMENS: left chest capsule  DRAINS: 59 Fr JP in left prepectoral space  Irene Limbo, MD [facility] Plastic & Reconstructive Surgery  Office/ physician access line after hours [phone]

## 2022-11-10 NOTE — Transfer of Care (Signed)
Immediate Anesthesia Transfer of Care Note  Patient: Tanairi Cypert Walkins  Procedure(s) Performed: REMOVAL BREAST IMPLANTS (Left: Chest) TISSUE EXPANDER PLACEMENT (Left: Chest)  Patient Location: PACU  Anesthesia Type:General  Level of Consciousness: drowsy  Airway & Oxygen Therapy: Patient Spontanous Breathing and Patient connected to face mask oxygen  Post-op Assessment: Report given to RN and Post -op Vital signs reviewed and stable  Post vital signs: Reviewed and stable  Last Vitals:  Vitals Value Taken Time  BP 139/86 11/10/22 1228  Temp    Pulse 84 11/10/22 1230  Resp 15 11/10/22 1230  SpO2 98 % 11/10/22 1230  Vitals shown include unvalidated device data.  Last Pain:  Vitals:   11/10/22 0937  TempSrc: Oral  PainSc: 0-No pain         Complications: No notable events documented.

## 2022-11-10 NOTE — Progress Notes (Unsigned)
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)  PRIOR THERAPY: - Left breast invasive carcinoma (2002): Lumpectomy, hysterectomy, AC followed by T chemotherapy followed by radiation therapy - Right breast DCIS (2019): XRT to right breast + Femara 2.5 mg daily started 07/09/2018  CURRENT THERAPY: Femara 2.5 mg daily  BRIEF ONCOLOGIC HISTORY:  Oncology History  DCIS (ductal carcinoma in situ)  05/14/2018 Cancer Staging   Staging form: Breast, AJCC 8th Edition - Clinical stage from 05/14/2018: Stage 0 (cTis (DCIS), cN0, cM0, ER+, PR+, HER2-) - Signed by Derek Jack, MD on 09/12/2021 Nuclear grade: G1   09/12/2021 Initial Diagnosis   DCIS (ductal carcinoma in situ)     CANCER STAGING: Cancer Staging  DCIS (ductal carcinoma in situ) Staging form: Breast, AJCC 8th Edition - Clinical stage from 05/14/2018: Stage 0 (cTis (DCIS), cN0, cM0, ER+, PR+, HER2-) - Signed by Derek Jack, MD on 09/12/2021   INTERVAL HISTORY:  Ms. Alexis Webb, a 56 y.o. female, returns for routine follow-up of her right breast DCIS (2019) and remote history of left breast invasive carcinoma (2002). [name] was last seen on 04/05/2022 by Dr. Delton Coombes.   At today's visit, she  reports feeling fairly well.  She recently underwent removal and replacement of left breast implant on 11/10/2022, but reports that she is doing well today.  Most recent diagnostic mammogram (unilateral, right) on 09/21/2022 showed no mammographic or sonographic abnormalities at site of patient's palpable concern and tenderness over her right axilla.  Overall, no mammographic evidence of malignancy in right breast.  She denies any symptoms of recurrence such as new lumps, bone  pain, chest pain, dyspnea, or abdominal pain.   She has no new headaches, seizures, or focal neurologic deficits.  No B symptoms such as fever, chills, night sweats, unintentional weight loss.  She continues to take letrozole (Femara) 2.5 mg daily, and is tolerating it fairly well, although she does report some alopecia, occasional hot flashes, and some diffuse musculoskeletal pain that she has been told is from arthritis. She is taking vitamin D 4000 units daily.  She reports 80% energy and 100% appetite.  She is maintaining stable weight at this time.   REVIEW OF SYSTEMS: Review of Systems  Constitutional:  Negative for appetite change, chills, diaphoresis, fatigue, fever and unexpected weight change.  HENT:   Negative for lump/mass and nosebleeds.   Eyes:  Negative for eye problems.  Respiratory:  Negative for cough, hemoptysis and shortness of breath.   Cardiovascular:  Negative for chest pain, leg swelling and palpitations.  Gastrointestinal:  Negative for abdominal pain, blood in stool, constipation, diarrhea, nausea and vomiting.  Genitourinary:  Negative for hematuria.   Musculoskeletal:  Positive for arthralgias.  Skin: Negative.   Neurological:  Negative for dizziness, headaches and light-headedness.  Hematological:  Does not bruise/bleed easily.    PAST MEDICAL/SURGICAL HISTORY:  Past Medical History:  Diagnosis Date   Anemia    years ago per pt   Cancer ([place]) 2002   left breast   Fatty liver    PONV (postoperative nausea and vomiting)    Past Surgical History:  Procedure Laterality Date   ANTERIOR CERVICAL DECOMP/DISCECTOMY FUSION N/A 10/13/2016   Procedure: C5-6, C6-7 Anterior Cervical Discectomy and Fusion, Allograft,  Plate;  Surgeon: [name], MD;  Location: [place];  Service: Orthopedics;  Laterality: N/A;   BREAST SURGERY     reconstructive breast surgery on left after mastectomy   MASTECTOMY Left 2002   TUBAL LIGATION      SOCIAL HISTORY:  Social History    Socioeconomic History   Marital status: Divorced    Spouse name: Not on file   Number of children: Not on file   Years of education: Not on file   Highest education level: Not on file  Occupational History   Not on file  Tobacco Use   Smoking status: Never   Smokeless tobacco: Never  Substance and Sexual Activity   Alcohol use: No   Drug use: No   Sexual activity: Not on file  Other Topics Concern   Not on file  Social History Narrative   Not on file   Social Determinants of Health   Financial Resource Strain: Not on file  Food Insecurity: Not on file  Transportation Needs: Not on file  Physical Activity: Not on file  Stress: Not on file  Social Connections: Not on file  Intimate Partner Violence: Not on file    FAMILY HISTORY:  Family History  Problem Relation Age of Onset   Hypertension Mother    Diabetes Mother    Hypertension Father     CURRENT MEDICATIONS:  Current Outpatient Medications  Medication Sig Dispense Refill   acetaminophen (TYLENOL) 500 MG tablet Take 500 mg by mouth every 6 (six) hours as needed.     Cholecalciferol 50 MCG (2000 UT) TABS Take 1 tablet by mouth every evening.     ergocalciferol (VITAMIN D2) 1.25 MG (50000 UT) capsule Take 1 capsule (50,000 Units total) by mouth once a week. 12 capsule 0   gabapentin (NEURONTIN) 300 MG capsule Take by mouth.     letrozole (FEMARA) 2.5 MG tablet Take 1 tablet (2.5 mg total) by mouth daily. 90 tablet 3   meloxicam (MOBIC) 7.5 MG tablet Take 1-2 tablets (7.5-15 mg total) by mouth daily. 30 tablet 0   No current facility-administered medications for this visit.    ALLERGIES:  No Known Allergies  PHYSICAL EXAM: Performance status (ECOG): 0 - Asymptomatic  There were no vitals filed for this visit. Wt Readings from Last 3 Encounters:  11/10/22 233 lb 14.5 oz (106.1 kg)  05/02/22 233 lb 8 oz (105.9 kg)  11/03/21 239 lb (108.4 kg)   Physical Exam Constitutional:      Appearance: Normal  appearance. She is obese.  HENT:     Head: Normocephalic and atraumatic.     Mouth/Throat:     Mouth: Mucous membranes are moist.  Eyes:     Extraocular Movements: Extraocular movements intact.     Pupils: Pupils are equal, round, and reactive to light.  Cardiovascular:     Rate and Rhythm: Normal rate and regular rhythm.     Pulses: Normal pulses.     Heart sounds: Normal heart sounds.  Pulmonary:     Effort: Pulmonary effort is normal.     Breath sounds: Normal breath sounds.  Chest:  Breasts:    Right: No swelling, bleeding, inverted nipple, mass, nipple discharge, skin change or tenderness.     Comments: >> LEFT breast exam deferred due to recent surgical replacement of implant - left breast covered with bandage with drain in place. >> RIGHT breast lumpectomy scar around the areola within normal limits.  No discrete nodules, masses, or  lymphadenopathy.  Abdominal:     General: Bowel sounds are normal.     Palpations: Abdomen is soft.     Tenderness: There is no abdominal tenderness.  Musculoskeletal:        General: No swelling.     Right lower leg: No edema.     Left lower leg: No edema.  Lymphadenopathy:     Cervical: No cervical adenopathy.     Upper Body:     Right upper body: No supraclavicular, axillary or pectoral adenopathy.  Skin:    General: Skin is warm and dry.  Neurological:     General: No focal deficit present.     Mental Status: She is alert and oriented to person, place, and time.  Psychiatric:        Mood and Affect: Mood normal.        Behavior: Behavior normal.      LABORATORY DATA:  I have reviewed the labs as listed.     Latest Ref Rng & Units 11/06/2022    1:37 PM 04/24/2022   11:15 AM 09/12/2021    3:13 PM  CBC  WBC 4.0 - 10.5 K/uL 9.0  8.5  7.1   Hemoglobin 12.0 - 15.0 g/dL 11.5  12.4  11.8   Hematocrit 36.0 - 46.0 % 37.0  41.1  37.2   Platelets 150 - 400 K/uL 210  206  214       Latest Ref Rng & Units 11/06/2022    1:37 PM  04/24/2022   11:15 AM 09/12/2021    3:13 PM  CMP  Glucose 70 - 99 mg/dL 153  118  93   BUN 6 - 20 mg/dL _0 Creatinine 0.44 - 1.00 mg/dL 0.71  0.74  0.67   Sodium 135 - 145 mmol/L 139  140  140   Potassium 3.5 - 5.1 mmol/L 3.5  3.7  3.9   Chloride 98 - 111 mmol/L 104  107  105   CO2 22 - 32 mmol/L _1 Calcium 8.9 - 10.3 mg/dL 8.7  8.9  9.1   Total Protein 6.5 - 8.1 g/dL 7.6  7.9  7.8   Total Bilirubin 0.3 - 1.2 mg/dL 0.5  0.4  0.6   Alkaline Phos 38 - 126 U/L 135  148  137   AST 15 - 41 U/L _2 ALT 0 - 44 U/L _3 DIAGNOSTIC IMAGING:  I have independently reviewed the scans and discussed with the patient. No results found.   ASSESSMENT & PLAN: 1.  Right breast DCIS (2019) - Right breast lumpectomy and SLNB on 05/14/2018 (initially evaluated by Dr. Federico Flake at [facility]) - Pathology shows DCIS.  No evidence of invasive carcinoma.  Margins negative.  0/3 lymph nodes involved.  ER 100%/PR 100%.  HER2 by FISH negative.  - XRT to right breast from 07/15/2018 through 08/06/2018 - Letrozole 2.5 mg daily since 07/09/2018.  She is tolerating letrozole fairly well with some alopecia, occasional hot flashes, and musculoskeletal pain - Most recent diagnostic mammogram (unilateral, right) on 09/21/2022 showed no mammographic or sonographic abnormalities at site of patient's palpable concern and tenderness over her right axilla.  Overall, no mammographic evidence of malignancy in right breast. - Physical exam today shows right lumpectomy scar around the areola within normal limits.  There is tenderness in the medial part of the breast,  with no clearly palpable mass.  Left breast exam deferred due to recent surgical replacement of implant.   - Patient history/ROS today negative for any "red flag" symptoms of breast cancer recurrence - Most recent labs (11/06/2022) shows mildly elevated alk phos 135, normal AST and ALT.  (Alkaline phosphatase elevated for several  years and stable, likely from fatty liver).  CBC unremarkable. - PLAN: Labs in 6 months followed by office visit and physical exam.   - Next mammogram due October 2024.    2.  Left breast invasive carcinoma (2002) - Reportedly diagnosed in 2002.  I do not have records to review. - She underwent a lumpectomy followed by mastectomy, followed by implant.  She received AC followed by T chemotherapy.  This was followed by radiation therapy.  No adjuvant antiestrogen therapy was given. - Recently underwent removal and replacement of left breast implant on 11/10/2022.    3.  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 - She is taking vitamin D 4000 units daily.  Unable to take calcium due to constipation. - Most recent vitamin D (11/06/2022): 31.43 - PLAN: We will plan on repeat DEXA scan in May 2025 (every 2 years while on aromatase inhibitor) - Continue vitamin D supplementation 4000 units daily   4.  High risk family and personal history - Genetic testing was done on 04/23/2018 at [facility].  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   5.  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  She is non-smoker. - Father had kidney cancer.  Paternal uncle had "stomach cancer".  Paternal aunt had breast cancer in her 5s.  Another paternal uncle had prostate cancer.  Another paternal uncle had pancreatic cancer.  Paternal grandmother also had pancreatic cancer. Paternal first cousin had breast cancer.    PLAN SUMMARY: >> Labs in 6 months (CBC/D, CMP, vitamin D) >> OFFICE visit 1 week after labs  All questions were answered. The patient knows to call the clinic with any problems, questions or concerns.  Medical decision making: Moderate  Time spent on visit: I spent 20 minutes counseling the patient face to face. The total time spent in the appointment was 30  minutes and more than 50% was on counseling.   Harriett Rush, PA-C  11/13/2022 11:16 AM

## 2022-11-10 NOTE — Interval H&P Note (Signed)
History and Physical Interval Note:  11/10/2022 9:24 AM  Alexis Webb  has presented today for surgery, with the diagnosis of hx breast cancer, acquired absence breast, hx therapeutic radiation, mechanical complication breast implants.  The various methods of treatment have been discussed with the patient and family. After consideration of risks, benefits and other options for treatment, the patient has consented to  removal left chest implant, placement left chest saline implant, possible tissue expander placement left chest as a surgical intervention.  The patient's history has been reviewed, patient examined, no change in status, stable for surgery.  I have reviewed the patient's chart and labs.  Questions were answered to the patient's satisfaction.     Arnoldo Hooker Kratos Ruscitti

## 2022-11-10 NOTE — Anesthesia Procedure Notes (Signed)
Procedure Name: Intubation Date/Time: 11/10/2022 10:18 AM  Performed by: Ezequiel Kayser, CRNAPre-anesthesia Checklist: Patient identified, Emergency Drugs available, Suction available and Patient being monitored Patient Re-evaluated:Patient Re-evaluated prior to induction Oxygen Delivery Method: Circle System Utilized Preoxygenation: Pre-oxygenation with 100% oxygen Induction Type: IV induction Ventilation: Mask ventilation without difficulty Laryngoscope Size: Mac and 3 Grade View: Grade I Tube type: Oral Tube size: 7.0 mm Number of attempts: 1 Airway Equipment and Method: Stylet and Oral airway Placement Confirmation: ETT inserted through vocal cords under direct vision, positive ETCO2 and breath sounds checked- equal and bilateral Secured at: 22 cm Tube secured with: Tape Dental Injury: Teeth and Oropharynx as per pre-operative assessment

## 2022-11-10 NOTE — Discharge Instructions (Addendum)
Post Anesthesia Home Care Instructions  Activity: Get plenty of rest for the remainder of the day. A responsible individual must stay with you for 24 hours following the procedure.  For the next 24 hours, DO NOT: -Drive a car -Paediatric nurse -Drink alcoholic beverages -Take any medication unless instructed by your physician -Make any legal decisions or sign important papers.  Meals: Start with liquid foods such as gelatin or soup. Progress to regular foods as tolerated. Avoid greasy, spicy, heavy foods. If nausea and/or vomiting occur, drink only clear liquids until the nausea and/or vomiting subsides. Call your physician if vomiting continues.  Special Instructions/Symptoms: Your throat may feel dry or sore from the anesthesia or the breathing tube placed in your throat during surgery. If this causes discomfort, gargle with warm salt water. The discomfort should disappear within 24 hours.  If you had a scopolamine patch placed behind your ear for the management of post- operative nausea and/or vomiting:  1. The medication in the patch is effective for 72 hours, after which it should be removed.  Wrap patch in a tissue and discard in the trash. Wash hands thoroughly with soap and water. 2. You may remove the patch earlier than 72 hours if you experience unpleasant side effects which may include dry mouth, dizziness or visual disturbances. 3. Avoid touching the patch. Wash your hands with soap and water after contact with the patch.  About my Jackson-Pratt Bulb Drain  What is a Jackson-Pratt bulb? A Jackson-Pratt is a soft, round device used to collect drainage. It is connected to a long, thin drainage catheter, which is held in place by one or two small stiches near your surgical incision site. When the bulb is squeezed, it forms a vacuum, forcing the drainage to empty into the bulb.  Emptying the Jackson-Pratt bulb- To empty the bulb: 1. Release the plug on the top of the  bulb. 2. Pour the bulb's contents into a measuring container which your nurse will provide. 3. Record the time emptied and amount of drainage. Empty the drain(s) as often as your     doctor or nurse recommends.  Date                  Time                    Amount (Drain 1)                 Amount (Drain 2)  _____________________________________________________________________  _____________________________________________________________________  _____________________________________________________________________  _____________________________________________________________________  _____________________________________________________________________  _____________________________________________________________________  _____________________________________________________________________  _____________________________________________________________________  Squeezing the Jackson-Pratt Bulb- To squeeze the bulb: 1. Make sure the plug at the top of the bulb is open. 2. Squeeze the bulb tightly in your fist. You will hear air squeezing from the bulb. 3. Replace the plug while the bulb is squeezed. 4. Use a safety pin to attach the bulb to your clothing. This will keep the catheter from     pulling at the bulb insertion site.  When to call your doctor- Call your doctor if: Drain site becomes red, swollen or hot. You have a fever greater than 101 degrees F. There is oozing at the drain site. Drain falls out (apply a guaze bandage over the drain hole and secure it with tape). Drainage increases daily not related to activity patterns. (You will usually have more drainage when you are active than when you are resting.) Drainage has a bad odor.   No tylenol or ibuprofen  until after 3:45pm today if needed.

## 2022-11-10 NOTE — Anesthesia Postprocedure Evaluation (Signed)
Anesthesia Post Note  Patient: Alexis Webb  Procedure(s) Performed: REMOVAL BREAST IMPLANTS (Left: Chest) TISSUE EXPANDER PLACEMENT (Left: Chest)     Patient location during evaluation: PACU Anesthesia Type: General Level of consciousness: awake and alert, patient cooperative and oriented Pain management: pain level controlled Vital Signs Assessment: post-procedure vital signs reviewed and stable Respiratory status: spontaneous breathing, nonlabored ventilation and respiratory function stable Cardiovascular status: blood pressure returned to baseline and stable Postop Assessment: no apparent nausea or vomiting, able to ambulate and adequate PO intake Anesthetic complications: no   No notable events documented.  Last Vitals:  Vitals:   11/10/22 1315 11/10/22 1330  BP:  (!) 155/87  Pulse: 73 63  Resp: 17 18  Temp:  (!) 36.2 C  SpO2: 91% 95%    Last Pain:  Vitals:   11/10/22 1330  TempSrc:   PainSc: 0-No pain                 Jalene Lacko,E. Adithya Difrancesco

## 2022-11-13 ENCOUNTER — Other Ambulatory Visit: Payer: Self-pay

## 2022-11-13 ENCOUNTER — Inpatient Hospital Stay: Payer: 59 | Admitting: Physician Assistant

## 2022-11-13 ENCOUNTER — Encounter (HOSPITAL_BASED_OUTPATIENT_CLINIC_OR_DEPARTMENT_OTHER): Payer: Self-pay | Admitting: Plastic Surgery

## 2022-11-13 VITALS — BP 130/80 | HR 88 | Temp 98.9°F | Resp 16 | Ht 65.0 in | Wt 236.2 lb

## 2022-11-13 DIAGNOSIS — D0511 Intraductal carcinoma in situ of right breast: Secondary | ICD-10-CM

## 2022-11-13 LAB — SURGICAL PATHOLOGY

## 2022-11-13 NOTE — Progress Notes (Signed)
Left message stating courtesy call and if any questions or concerns please call the doctors office.  

## 2022-11-13 NOTE — Patient Instructions (Signed)
[place] [facility] [place] **   You were seen today by Tarri Abernethy PA-C for your history of breast cancer.  There were no signs of recurrent breast cancer in your physical exam, labs, mammogram, or symptoms today. We will check labs and see you for another office visit and breast exam in 6 months.  ** Thank you for trusting me with your healthcare!  I strive to provide all of my patients with quality care at each visit.  If you receive a survey for this visit, I would be so grateful to you for taking the time to provide feedback.  Thank you in advance!  ~ Ramey [place]cherside                   Dr. Derek Jack   &   Tarri Abernethy, PA-C   - - - - - - - - - - - - - - - - - -    Thank you for choosing [place] at [facility] to provide your oncology and hematology care.  To afford each patient quality time with our provider, please arrive at least 15 minutes before your scheduled appointment time.   If you have a lab appointment with the [facility] please come in thru the Main Entrance and check in at the main information desk.  You need to re-schedule your appointment should you arrive 10 or more minutes late.  We strive to give you quality time with our providers, and arriving late affects you and other patients whose appointments are after yours.  Also, if you no show three or more times for appointments you may be dismissed from the clinic at the providers discretion.     Again, thank you for choosing [facility].  Our hope is that these requests will decrease the amount of time that you wait before being seen by our physicians.       _____________________________________________________________  Should you have questions after your visit to [facility], please contact our office at [phone] and follow the prompts.  Our office hours are 8:00 a.m. and 4:30 p.m. Monday  - Friday.  Please note that voicemails left after 4:00 p.m. may not be returned until the following business day.  We are closed weekends and major holidays.  You do have access to a nurse 24-7, just call the main number to the clinic [phone] and do not press any options, hold on the line and a nurse will answer the phone.    For prescription refill requests, have your pharmacy contact our office and allow 72 hours.

## 2022-11-24 DIAGNOSIS — M2391 Unspecified internal derangement of right knee: Secondary | ICD-10-CM | POA: Diagnosis not present

## 2022-11-24 DIAGNOSIS — M25561 Pain in right knee: Secondary | ICD-10-CM | POA: Diagnosis not present

## 2022-11-24 DIAGNOSIS — M25562 Pain in left knee: Secondary | ICD-10-CM | POA: Diagnosis not present

## 2022-12-25 DIAGNOSIS — Z791 Long term (current) use of non-steroidal anti-inflammatories (NSAID): Secondary | ICD-10-CM | POA: Diagnosis not present

## 2022-12-25 DIAGNOSIS — M1990 Unspecified osteoarthritis, unspecified site: Secondary | ICD-10-CM | POA: Diagnosis not present

## 2022-12-25 DIAGNOSIS — R030 Elevated blood-pressure reading, without diagnosis of hypertension: Secondary | ICD-10-CM | POA: Diagnosis not present

## 2022-12-25 DIAGNOSIS — C50919 Malignant neoplasm of unspecified site of unspecified female breast: Secondary | ICD-10-CM | POA: Diagnosis not present

## 2022-12-25 DIAGNOSIS — Z833 Family history of diabetes mellitus: Secondary | ICD-10-CM | POA: Diagnosis not present

## 2022-12-25 DIAGNOSIS — Z6839 Body mass index (BMI) 39.0-39.9, adult: Secondary | ICD-10-CM | POA: Diagnosis not present

## 2022-12-25 DIAGNOSIS — Z79811 Long term (current) use of aromatase inhibitors: Secondary | ICD-10-CM | POA: Diagnosis not present

## 2022-12-25 DIAGNOSIS — Z8249 Family history of ischemic heart disease and other diseases of the circulatory system: Secondary | ICD-10-CM | POA: Diagnosis not present

## 2022-12-25 DIAGNOSIS — R32 Unspecified urinary incontinence: Secondary | ICD-10-CM | POA: Diagnosis not present

## 2023-01-24 ENCOUNTER — Other Ambulatory Visit: Payer: Self-pay | Admitting: Hematology

## 2023-02-06 DIAGNOSIS — M5416 Radiculopathy, lumbar region: Secondary | ICD-10-CM | POA: Diagnosis not present

## 2023-03-05 DIAGNOSIS — M5136 Other intervertebral disc degeneration, lumbar region: Secondary | ICD-10-CM | POA: Diagnosis not present

## 2023-03-05 DIAGNOSIS — M5416 Radiculopathy, lumbar region: Secondary | ICD-10-CM | POA: Diagnosis not present

## 2023-03-05 DIAGNOSIS — Z6838 Body mass index (BMI) 38.0-38.9, adult: Secondary | ICD-10-CM | POA: Diagnosis not present

## 2023-05-04 DIAGNOSIS — R42 Dizziness and giddiness: Secondary | ICD-10-CM | POA: Diagnosis not present

## 2023-05-04 DIAGNOSIS — M1712 Unilateral primary osteoarthritis, left knee: Secondary | ICD-10-CM | POA: Diagnosis not present

## 2023-05-04 DIAGNOSIS — E559 Vitamin D deficiency, unspecified: Secondary | ICD-10-CM | POA: Diagnosis not present

## 2023-05-04 DIAGNOSIS — Z6839 Body mass index (BMI) 39.0-39.9, adult: Secondary | ICD-10-CM | POA: Diagnosis not present

## 2023-05-04 DIAGNOSIS — Z130 Encounter for screening for diseases of the blood and blood-forming organs and certain disorders involving the immune mechanism: Secondary | ICD-10-CM | POA: Diagnosis not present

## 2023-05-04 DIAGNOSIS — M1711 Unilateral primary osteoarthritis, right knee: Secondary | ICD-10-CM | POA: Diagnosis not present

## 2023-05-04 DIAGNOSIS — R5383 Other fatigue: Secondary | ICD-10-CM | POA: Diagnosis not present

## 2023-05-04 DIAGNOSIS — R030 Elevated blood-pressure reading, without diagnosis of hypertension: Secondary | ICD-10-CM | POA: Diagnosis not present

## 2023-05-08 ENCOUNTER — Encounter: Payer: Self-pay | Admitting: *Deleted

## 2023-05-11 ENCOUNTER — Inpatient Hospital Stay: Payer: Medicare HMO | Attending: Physician Assistant

## 2023-05-11 ENCOUNTER — Other Ambulatory Visit: Payer: 59

## 2023-05-11 DIAGNOSIS — M5416 Radiculopathy, lumbar region: Secondary | ICD-10-CM | POA: Diagnosis not present

## 2023-05-11 DIAGNOSIS — M8580 Other specified disorders of bone density and structure, unspecified site: Secondary | ICD-10-CM | POA: Diagnosis not present

## 2023-05-11 DIAGNOSIS — Z923 Personal history of irradiation: Secondary | ICD-10-CM | POA: Diagnosis not present

## 2023-05-11 DIAGNOSIS — Z853 Personal history of malignant neoplasm of breast: Secondary | ICD-10-CM | POA: Diagnosis not present

## 2023-05-11 DIAGNOSIS — Z79811 Long term (current) use of aromatase inhibitors: Secondary | ICD-10-CM | POA: Diagnosis not present

## 2023-05-11 DIAGNOSIS — D0511 Intraductal carcinoma in situ of right breast: Secondary | ICD-10-CM | POA: Insufficient documentation

## 2023-05-11 LAB — CBC WITH DIFFERENTIAL/PLATELET
Abs Immature Granulocytes: 0.06 10*3/uL (ref 0.00–0.07)
Basophils Absolute: 0 10*3/uL (ref 0.0–0.1)
Basophils Relative: 0 %
Eosinophils Absolute: 0.1 10*3/uL (ref 0.0–0.5)
Eosinophils Relative: 1 %
HCT: 37.4 % (ref 36.0–46.0)
Hemoglobin: 11.5 g/dL — ABNORMAL LOW (ref 12.0–15.0)
Immature Granulocytes: 1 %
Lymphocytes Relative: 24 %
Lymphs Abs: 1.8 10*3/uL (ref 0.7–4.0)
MCH: 28.4 pg (ref 26.0–34.0)
MCHC: 30.7 g/dL (ref 30.0–36.0)
MCV: 92.3 fL (ref 80.0–100.0)
Monocytes Absolute: 0.4 10*3/uL (ref 0.1–1.0)
Monocytes Relative: 6 %
Neutro Abs: 5 10*3/uL (ref 1.7–7.7)
Neutrophils Relative %: 68 %
Platelets: 234 10*3/uL (ref 150–400)
RBC: 4.05 MIL/uL (ref 3.87–5.11)
RDW: 14.6 % (ref 11.5–15.5)
WBC: 7.4 10*3/uL (ref 4.0–10.5)
nRBC: 0 % (ref 0.0–0.2)

## 2023-05-11 LAB — COMPREHENSIVE METABOLIC PANEL
ALT: 12 U/L (ref 0–44)
AST: 19 U/L (ref 15–41)
Albumin: 3.7 g/dL (ref 3.5–5.0)
Alkaline Phosphatase: 118 U/L (ref 38–126)
Anion gap: 10 (ref 5–15)
BUN: 13 mg/dL (ref 6–20)
CO2: 26 mmol/L (ref 22–32)
Calcium: 8.9 mg/dL (ref 8.9–10.3)
Chloride: 105 mmol/L (ref 98–111)
Creatinine, Ser: 0.71 mg/dL (ref 0.44–1.00)
GFR, Estimated: >60 mL/min (ref >60)
Glucose, Bld: 143 mg/dL — ABNORMAL HIGH (ref 70–99)
Potassium: 4.2 mmol/L (ref 3.5–5.1)
Sodium: 141 mmol/L (ref 135–145)
Total Bilirubin: 0.8 mg/dL (ref 0.3–1.2)
Total Protein: 7.4 g/dL (ref 6.5–8.1)

## 2023-05-11 LAB — VITAMIN D 25 HYDROXY (VIT D DEFICIENCY, FRACTURES): Vit D, 25-Hydroxy: 46.55 ng/mL (ref 30–100)

## 2023-05-14 DIAGNOSIS — M25561 Pain in right knee: Secondary | ICD-10-CM | POA: Diagnosis not present

## 2023-05-14 DIAGNOSIS — M170 Bilateral primary osteoarthritis of knee: Secondary | ICD-10-CM | POA: Diagnosis not present

## 2023-05-14 DIAGNOSIS — G8929 Other chronic pain: Secondary | ICD-10-CM | POA: Diagnosis not present

## 2023-05-14 DIAGNOSIS — M25562 Pain in left knee: Secondary | ICD-10-CM | POA: Diagnosis not present

## 2023-05-17 NOTE — Progress Notes (Signed)
[facility] [address], [place] [address]   CLINIC:  Medical Oncology/Hematology  PCP:  Dion Saucier, PA-C [address] 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 + Femara 2.5 mg daily started 07/09/2018   CURRENT THERAPY: Femara 2.5 mg daily  BRIEF ONCOLOGIC HISTORY:   Oncology History  DCIS (ductal carcinoma in situ)  05/14/2018 Cancer Staging   Staging form: Breast, AJCC 8th Edition - Clinical stage from 05/14/2018: Stage 0 (cTis (DCIS), cN0, cM0, ER+, PR+, HER2-) - Signed by Doreatha Massed, MD on 09/12/2021 Nuclear grade: G1   09/12/2021 Initial Diagnosis   DCIS (ductal carcinoma in situ)     CANCER STAGING: Cancer Staging  DCIS (ductal carcinoma in situ) Staging form: Breast, AJCC 8th Edition - Clinical stage from 05/14/2018: Stage 0 (cTis (DCIS), cN0, cM0, ER+, PR+, HER2-) - Signed by Doreatha Massed, MD on 09/12/2021   INTERVAL HISTORY:   Ms. Alexis Webb, a 57 y.o. female, returns for routine follow-up of her right breast DCIS (2019) remote history of left breast invasive carcinoma (2002). [name] was last seen on 11/13/2022 by Rojelio Brenner PA-C.   At today's visit, she  reports feeling well.  She denies any recent hospitalizations, surgeries, or changes in her  baseline health status.  She denies any symptoms of recurrence such as new lumps, bone pain, chest pain, dyspnea, or abdominal pain.   She has no new headaches, seizures, or focal neurologic deficits.  No B symptoms such as fever, chills, night sweats, unintentional weight loss.   She continues to take letrozole (Femara) 2.5 mg daily, and is tolerating it fairly well, although she  does report some alopecia, occasional hot flashes, and some diffuse musculoskeletal pain that she has been told is from arthritis.   She is taking vitamin D 4000 units daily.  She reports 50% energy and 85% appetite.  She is maintaining stable weight at this time.   ASSESSMENT & PLAN:  1.  Right breast DCIS (2019) - Right breast lumpectomy and SLNB on 05/14/2018 (initially evaluated by Dr. Angelene Giovanni at [facility]) - Pathology shows DCIS.  No evidence of invasive carcinoma.  Margins negative.  0/3 lymph nodes involved.  ER 100%/PR 100%.  HER2 by FISH negative.  - XRT to right breast from 07/15/2018 through 08/06/2018 - Letrozole 2.5 mg daily since 07/09/2018.  She is tolerating letrozole fairly well with some alopecia, occasional hot flashes, and musculoskeletal pain - Most recent diagnostic mammogram (unilateral, right) on 09/21/2022 showed no mammographic or sonographic abnormalities at site of patient's palpable concern and tenderness over her right axilla.  Overall, no mammographic evidence of malignancy in right breast. - Physical exam today shows right lumpectomy scar around the areola within normal limits.  There is tenderness in the medial part of the breast, with no clearly palpable mass.  Left breast exam within normal limits s/p mastectomy and breast implant. - Patient history/ROS today negative for any "red flag" symptoms of breast cancer recurrence - Most recent labs (05/11/2023) shows normal LFTs and kidney function.  CBC unremarkable. - PLAN: Labs in 6 months followed by office visit and physical exam. - Patient will have completed 5 years of letrozole as of August  2024.  She is instructed to stop taking letrozole as of 08/05/2023.   - Next mammogram due October 2024.     2.  Left breast invasive carcinoma (2002) - Reportedly diagnosed in 2002.  I do not have records to review. - She underwent a lumpectomy followed by mastectomy, followed by implant.  She received AC followed by T  chemotherapy.  This was followed by radiation therapy.  No adjuvant antiestrogen therapy was given. - Recently underwent removal and replacement of left breast implant on 11/10/2022.     3.  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 - She is taking vitamin D 4000 units daily.  Unable to take calcium due to constipation. - Most recent vitamin D (05/11/2023): 46.55 - PLAN: We will plan on repeat DEXA scan in May 2025 (every 2 years while on aromatase inhibitor) - Continue vitamin D supplementation 4000 units daily    4.  High risk family and personal history - Genetic testing was done on 04/23/2018 at [facility].  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    5.  Social and family history - She worked in [employer] and lost her job in July 2022.  She is non-smoker. - Father had kidney cancer.  Paternal uncle had "stomach cancer".  Paternal aunt had breast cancer in her 43s.  Another paternal uncle had prostate cancer.  Another paternal uncle had pancreatic cancer.  Paternal grandmother also had pancreatic cancer. Paternal first cousin had breast cancer.   PLAN SUMMARY: >> Mammogram in October 2024 >> Labs in October 2024 = CBC/D, CMP, vitamin D, ferritin, iron/TIBC >> OFFICE visit in 6 months (1 week after labs/mammogram)    REVIEW OF SYSTEMS:   Review of Systems  Constitutional:  Positive for fatigue. Negative for appetite change, chills, diaphoresis, fever and unexpected weight change.  HENT:   Negative for lump/mass and nosebleeds.   Eyes:  Negative for eye problems.  Respiratory:  Negative for cough, hemoptysis and shortness of breath.   Cardiovascular:  Negative for chest pain, leg swelling and palpitations.  Gastrointestinal:  Negative for abdominal pain, blood in stool, constipation, diarrhea, nausea and vomiting.  Genitourinary:  Negative for hematuria.   Musculoskeletal:  Positive  for arthralgias.  Skin: Negative.   Neurological:  Negative for dizziness, headaches and light-headedness.  Hematological:  Does not bruise/bleed easily.    PHYSICAL EXAM:   Performance status (ECOG): 0 - Asymptomatic  There were no vitals filed for this visit. Wt Readings from Last 3 Encounters:  11/13/22 236 lb 3.2 oz (107.1 kg)  11/10/22 233 lb 14.5 oz (106.1 kg)  05/02/22 233 lb 8 oz (105.9 kg)   Physical Exam Constitutional:      Appearance: Normal appearance. She is obese.  Cardiovascular:     Heart sounds: Normal heart sounds.  Pulmonary:     Breath sounds: Normal breath sounds.  Chest:       Comments: Right lumpectomy scar around the areola within normal limits.  Left breast exam within normal limits s/p mastectomy and breast implant.  No discrete nodules, masses, or lymphadenopathy bilaterally. Lymphadenopathy:     Upper Body:     Right upper body: No supraclavicular, axillary or pectoral adenopathy.     Left upper body: No supraclavicular, axillary or pectoral adenopathy.  Neurological:     General: No focal deficit present.     Mental Status: Mental status  is at baseline.  Psychiatric:        Behavior: Behavior normal. Behavior is cooperative.      PAST MEDICAL/SURGICAL HISTORY:  Past Medical History:  Diagnosis Date   Anemia    years ago per pt   Cancer (HCC) 2002   left breast   Fatty liver    PONV (postoperative nausea and vomiting)    Past Surgical History:  Procedure Laterality Date   ANTERIOR CERVICAL DECOMP/DISCECTOMY FUSION N/A 10/13/2016   Procedure: C5-6, C6-7 Anterior Cervical Discectomy and Fusion, Allograft, Plate;  Surgeon: Eldred Manges, MD;  Location: [facility] OR;  Service: Orthopedics;  Laterality: N/A;   BREAST IMPLANT REMOVAL Left 11/10/2022   Procedure: REMOVAL BREAST IMPLANTS;  Surgeon: Glenna Fellows, MD;  Location: [facility];  Service: Plastics;  Laterality: Left;   BREAST SURGERY     reconstructive breast surgery on  left after mastectomy   MASTECTOMY Left 2002   TISSUE EXPANDER PLACEMENT Left 11/10/2022   Procedure: TISSUE EXPANDER PLACEMENT;  Surgeon: Glenna Fellows, MD;  Location: [facility];  Service: Plastics;  Laterality: Left;   TUBAL LIGATION      SOCIAL HISTORY:  Social History   Socioeconomic History   Marital status: Divorced    Spouse name: Not on file   Number of children: Not on file   Years of education: Not on file   Highest education level: Not on file  Occupational History   Not on file  Tobacco Use   Smoking status: Never   Smokeless tobacco: Never  Substance and Sexual Activity   Alcohol use: No   Drug use: No   Sexual activity: Not on file  Other Topics Concern   Not on file  Social History Narrative   Not on file   Social Determinants of Health   Financial Resource Strain: Not on file  Food Insecurity: Not on file  Transportation Needs: Not on file  Physical Activity: Not on file  Stress: Not on file  Social Connections: Not on file  Intimate Partner Violence: Not on file    FAMILY HISTORY:  Family History  Problem Relation Age of Onset   Hypertension Mother    Diabetes Mother    Hypertension Father     CURRENT MEDICATIONS:  Current Outpatient Medications  Medication Sig Dispense Refill   acetaminophen (TYLENOL) 500 MG tablet Take 500 mg by mouth every 6 (six) hours as needed.     Cholecalciferol 50 MCG (2000 UT) TABS Take 1 tablet by mouth every evening.     gabapentin (NEURONTIN) 300 MG capsule Take by mouth.     letrozole (FEMARA) 2.5 MG tablet TAKE 1 TABLET BY MOUTH EVERY DAY 30 tablet 11   meloxicam (MOBIC) 7.5 MG tablet Take 1-2 tablets (7.5-15 mg total) by mouth daily. 30 tablet 0   No current facility-administered medications for this visit.    ALLERGIES:  No Known Allergies  LABORATORY DATA:  I have reviewed the labs as listed.     Latest Ref Rng & Units 05/11/2023    8:34 AM 11/06/2022    1:37 PM 04/24/2022   11:15  AM  CBC  WBC 4.0 - 10.5 K/uL 7.4  9.0  8.5   Hemoglobin 12.0 - 15.0 g/dL 62.1  30.8  65.7   Hematocrit 36.0 - 46.0 % 37.4  37.0  41.1   Platelets 150 - 400 K/uL 234  210  206       Latest Ref Rng & Units  05/11/2023    8:34 AM 11/06/2022    1:37 PM 04/24/2022   11:15 AM  CMP  Glucose 70 - 99 mg/dL 981  191  478   BUN 6 - 20 mg/dL 13  13  15    Creatinine 0.44 - 1.00 mg/dL 2.95  6.21  3.08   Sodium 135 - 145 mmol/L 141  139  140   Potassium 3.5 - 5.1 mmol/L 4.2  3.5  3.7   Chloride 98 - 111 mmol/L 105  104  107   CO2 22 - 32 mmol/L 26  26  28    Calcium 8.9 - 10.3 mg/dL 8.9  8.7  8.9   Total Protein 6.5 - 8.1 g/dL 7.4  7.6  7.9   Total Bilirubin 0.3 - 1.2 mg/dL 0.8  0.5  0.4   Alkaline Phos 38 - 126 U/L 118  135  148   AST 15 - 41 U/L 19  22  18    ALT 0 - 44 U/L 12  10  12      DIAGNOSTIC IMAGING:  I have independently reviewed the scans and discussed with the patient. No results found.   WRAP UP:  All questions were answered. The patient knows to call the clinic with any problems, questions or concerns.  Medical decision making: Moderate  Time spent on visit: I spent 20 minutes counseling the patient face to face. The total time spent in the appointment was 30 minutes and more than 50% was on counseling.  Carnella Guadalajara, PA-C  05/18/2023 10:10 AM

## 2023-05-18 ENCOUNTER — Other Ambulatory Visit (HOSPITAL_COMMUNITY): Payer: Self-pay | Admitting: Physician Assistant

## 2023-05-18 ENCOUNTER — Inpatient Hospital Stay: Payer: Medicare HMO | Admitting: Physician Assistant

## 2023-05-18 VITALS — BP 149/90 | HR 63 | Temp 98.4°F | Resp 18 | Ht 65.0 in | Wt 232.7 lb

## 2023-05-18 DIAGNOSIS — Z1231 Encounter for screening mammogram for malignant neoplasm of breast: Secondary | ICD-10-CM

## 2023-05-18 DIAGNOSIS — R030 Elevated blood-pressure reading, without diagnosis of hypertension: Secondary | ICD-10-CM | POA: Diagnosis not present

## 2023-05-18 DIAGNOSIS — E538 Deficiency of other specified B group vitamins: Secondary | ICD-10-CM | POA: Diagnosis not present

## 2023-05-18 DIAGNOSIS — D649 Anemia, unspecified: Secondary | ICD-10-CM | POA: Diagnosis not present

## 2023-05-18 DIAGNOSIS — Z853 Personal history of malignant neoplasm of breast: Secondary | ICD-10-CM | POA: Diagnosis not present

## 2023-05-18 DIAGNOSIS — M8580 Other specified disorders of bone density and structure, unspecified site: Secondary | ICD-10-CM | POA: Diagnosis not present

## 2023-05-18 DIAGNOSIS — Z923 Personal history of irradiation: Secondary | ICD-10-CM | POA: Diagnosis not present

## 2023-05-18 DIAGNOSIS — D0511 Intraductal carcinoma in situ of right breast: Secondary | ICD-10-CM | POA: Diagnosis not present

## 2023-05-18 DIAGNOSIS — Z79811 Long term (current) use of aromatase inhibitors: Secondary | ICD-10-CM | POA: Diagnosis not present

## 2023-05-18 NOTE — Patient Instructions (Signed)
[facility] at [facility] **VISIT SUMMARY & IMPORTANT INSTRUCTIONS **   You were seen today by Rojelio Brenner PA-C for your history of breast cancer.  There were no signs of recurrent breast cancer in your physical exam, labs, mammogram, or symptoms today. You can STOP taking your letrozole (Femara) as of August 05, 2023. Your are due for your mammogram in October 2024. We will check labs and see you for another office visit and breast exam in 6 months.  ** Thank you for trusting me with your healthcare!  I strive to provide all of my patients with quality care at each visit.  If you receive a survey for this visit, I would be so grateful to you for taking the time to provide feedback.  Thank you in advance!  ~ Aleyna Cueva                   Dr. Doreatha Massed   &   Rojelio Brenner, PA-C   - - - - - - - - - - - - - - - - - -    Thank you for choosing [facility] at [facility] to provide your oncology and hematology care.  To afford each patient quality time with our provider, please arrive at least 15 minutes before your scheduled appointment time.   If you have a lab appointment with the Cancer Center please come in thru the Main Entrance and check in at the main information desk.  You need to re-schedule your appointment should you arrive 10 or more minutes late.  We strive to give you quality time with our providers, and arriving late affects you and other patients whose appointments are after yours.  Also, if you no show three or more times for appointments you may be dismissed from the clinic at the providers discretion.     Again, thank you for choosing [facility].  Our hope is that these requests will decrease the amount of time that you wait before being seen by our physicians.       _____________________________________________________________  Should you have questions after your [facility],  please contact our office at [phone] and follow the prompts.  Our office hours are 8:00 a.m. and 4:30 p.m. Monday - Friday.  Please note that voicemails left after 4:00 p.m. may not be returned until the following business day.  We are closed weekends and major holidays.  You do have access to a nurse 24-7, just call the main number to the clinic [phone] and do not press any options, hold on the line and a nurse will answer the phone.    For prescription refill requests, have your pharmacy contact our office and allow 72 hours.

## 2023-05-18 NOTE — Addendum Note (Signed)
Addended by: Rojelio Brenner on: 05/18/2023 10:12 AM   Modules accepted: Orders

## 2023-05-25 ENCOUNTER — Other Ambulatory Visit: Payer: Self-pay | Admitting: *Deleted

## 2023-05-25 DIAGNOSIS — Z1211 Encounter for screening for malignant neoplasm of colon: Secondary | ICD-10-CM

## 2023-05-25 DIAGNOSIS — R030 Elevated blood-pressure reading, without diagnosis of hypertension: Secondary | ICD-10-CM | POA: Diagnosis not present

## 2023-05-25 DIAGNOSIS — E538 Deficiency of other specified B group vitamins: Secondary | ICD-10-CM | POA: Diagnosis not present

## 2023-05-28 DIAGNOSIS — Z6837 Body mass index (BMI) 37.0-37.9, adult: Secondary | ICD-10-CM | POA: Diagnosis not present

## 2023-05-28 DIAGNOSIS — R030 Elevated blood-pressure reading, without diagnosis of hypertension: Secondary | ICD-10-CM | POA: Diagnosis not present

## 2023-05-28 DIAGNOSIS — J069 Acute upper respiratory infection, unspecified: Secondary | ICD-10-CM | POA: Diagnosis not present

## 2023-05-31 ENCOUNTER — Ambulatory Visit: Payer: Medicare HMO | Admitting: Surgery

## 2023-05-31 ENCOUNTER — Encounter: Payer: Self-pay | Admitting: Surgery

## 2023-05-31 VITALS — BP 132/85 | HR 103 | Temp 98.1°F | Resp 12 | Ht 65.0 in | Wt 227.0 lb

## 2023-05-31 DIAGNOSIS — Z86010 Personal history of colonic polyps: Secondary | ICD-10-CM

## 2023-05-31 MED ORDER — SUTAB 1479-225-188 MG PO TABS: ORAL_TABLET | ORAL | 0 refills | Status: DC

## 2023-05-31 NOTE — H&P (Signed)
[facility] History and Physical  Reason for Referral: Colonoscopy Referring Physician: Lorie Phenix, PA  Chief Complaint   New Patient (Initial Visit)     Alexis Webb is a 57 y.o. female.  HPI: Patient presents to discuss colonoscopy.  Her last colonoscopy was 5 years ago, at which time she was found to have 2 polyps.  One of her polyps was hyperplastic and the other was a tubular adenoma in her cecum.  She was recommended to undergo repeat colonoscopy in 5 years.  She has no family history of colorectal cancer.  She has no abdominal pain, nausea, or vomiting.  She denies any hematochezia or melena.  Her past medical history is significant for left breast cancer.  Her surgical history is significant for multiple breast surgeries related to her breast cancer.  She denies any history of abdominal surgeries, though tubal ligation is listed on her EMR.  She denies use of blood thinning medications.  She denies use of tobacco products, alcohol, and illicit drugs.  Past Medical History:  Diagnosis Date   Anemia    years ago per pt   Cancer (HCC) 2002   left breast   Fatty liver    PONV (postoperative nausea and vomiting)     Past Surgical History:  Procedure Laterality Date   ANTERIOR CERVICAL DECOMP/DISCECTOMY FUSION N/A 10/13/2016   Procedure: C5-6, C6-7 Anterior Cervical Discectomy and Fusion, Allograft, Plate;  Surgeon: Eldred Manges, MD;  Location: [facility] OR;  Service: Orthopedics;  Laterality: N/A;   BREAST IMPLANT REMOVAL Left 11/10/2022   Procedure: REMOVAL BREAST IMPLANTS;  Surgeon: Glenna Fellows, MD;  Location: [facility];  Service: Plastics;  Laterality: Left;   BREAST SURGERY     reconstructive breast surgery on left after mastectomy   MASTECTOMY Left 2002   TISSUE EXPANDER PLACEMENT Left 11/10/2022   Procedure: TISSUE EXPANDER PLACEMENT;  Surgeon: Glenna Fellows, MD;  Location: [facility];  Service: Plastics;  Laterality:  Left;   TUBAL LIGATION      Family History  Problem Relation Age of Onset   Hypertension Mother    Diabetes Mother    Hypertension Father     Social History   Tobacco Use   Smoking status: Never   Smokeless tobacco: Never  Substance Use Topics   Alcohol use: No   Drug use: No    Medications: I have reviewed the patient's current medications. Allergies as of 05/31/2023   No Known Allergies      Medication List        Accurate as of May 31, 2023 10:35 AM. If you have any questions, ask your nurse or doctor.          acetaminophen 500 MG tablet Commonly known as: TYLENOL Take 500 mg by mouth every 6 (six) hours as needed.   Cholecalciferol 50 MCG (2000 UT) Tabs Take 1 tablet by mouth every evening.   letrozole 2.5 MG tablet Commonly known as: FEMARA TAKE 1 TABLET BY MOUTH EVERY DAY   meloxicam 7.5 MG tablet Commonly known as: Mobic Take 1-2 tablets (7.5-15 mg total) by mouth daily.         ROS:  Constitutional: negative for chills, fatigue, and fevers Eyes: negative for visual disturbance and pain Ears, nose, mouth, throat, and face: positive for sinus problems, negative for ear drainage and sore throat Respiratory: negative for cough, wheezing, and shortness of breath Cardiovascular: negative for chest pain and palpitations Gastrointestinal: negative for abdominal pain,  nausea, reflux symptoms, and vomiting Genitourinary:negative for dysuria and frequency Integument/breast: negative for dryness and rash Hematologic/lymphatic: negative for bleeding and lymphadenopathy Musculoskeletal:positive for back pain and neck pain Neurological: negative for dizziness and tremors Endocrine: negative for temperature intolerance  Blood pressure 132/85, pulse (!) 103, temperature 98.1 F (36.7 C), temperature source Oral, resp. rate 12, height 5\' 5"  (1.651 m), weight 227 lb (103 kg), SpO2 96 %. Physical Exam Constitutional:      Appearance: Normal appearance.   HENT:     Head: Normocephalic and atraumatic.  Eyes:     Extraocular Movements: Extraocular movements intact.     Pupils: Pupils are equal, round, and reactive to light.  Cardiovascular:     Rate and Rhythm: Normal rate and regular rhythm.  Pulmonary:     Effort: Pulmonary effort is normal.     Breath sounds: Normal breath sounds.  Abdominal:     Comments: Abdomen soft, nondistended, no percussion tenderness, nontender to palpation; no rigidity, guarding, rebound tenderness  Musculoskeletal:        General: Normal range of motion.     Cervical back: Normal range of motion.  Skin:    General: Skin is warm and dry.  Neurological:     General: No focal deficit present.     Mental Status: She is alert and oriented to person, place, and time.  Psychiatric:        Mood and Affect: Mood normal.        Behavior: Behavior normal.     Results: No results found for this or any previous visit (from the past 48 hour(s)).  No results found.   Assessment & Plan:  Alexis Webb is a 57 y.o. female who presents to discuss colonoscopy.  -I discussed the recommendations for colon cancer screening, and the utility of colonoscopy with colon cancer screening.  Given her history of colon polyps and the recommendation for repeat colonoscopy at 5 years, she is due for her next colonoscopy. -The risk and benefits of colonoscopy were discussed including but not limited to bleeding, infection, missed lesions, and perforation requiring surgery.  After careful consideration, Alexis Webb has decided to proceed with colonoscopy. -Patient tentatively scheduled for colonoscopy on 7/23 -Prescription sent in for Sutab prep.  Explained Sutab prep to the patient and provided information and coupon for this prep -Provided information regarding colonoscopies  All questions were answered to the satisfaction of the patient and family.  Theophilus Kinds, DO [facility] [address] [place], [place] [address] [phone] (office)

## 2023-05-31 NOTE — Progress Notes (Signed)
[facility] History and Physical  Reason for Referral: Colonoscopy Referring Physician: Alyssa Boles, PA  Chief Complaint   New Patient (Initial Visit)     Alexis Webb is a 56 y.o. female.  HPI: Patient presents to discuss colonoscopy.  Her last colonoscopy was 5 years ago, at which time she was found to have 2 polyps.  One of her polyps was hyperplastic and the other was a tubular adenoma in her cecum.  She was recommended to undergo repeat colonoscopy in 5 years.  She has no family history of colorectal cancer.  She has no abdominal pain, nausea, or vomiting.  She denies any hematochezia or melena.  Her past medical history is significant for left breast cancer.  Her surgical history is significant for multiple breast surgeries related to her breast cancer.  She denies any history of abdominal surgeries, though tubal ligation is listed on her EMR.  She denies use of blood thinning medications.  She denies use of tobacco products, alcohol, and illicit drugs.  Past Medical History:  Diagnosis Date   Anemia    years ago per pt   Cancer (HCC) 2002   left breast   Fatty liver    PONV (postoperative nausea and vomiting)     Past Surgical History:  Procedure Laterality Date   ANTERIOR CERVICAL DECOMP/DISCECTOMY FUSION N/A 10/13/2016   Procedure: C5-6, C6-7 Anterior Cervical Discectomy and Fusion, Allograft, Plate;  Surgeon: Mark C Yates, MD;  Location: [facility] OR;  Service: Orthopedics;  Laterality: N/A;   BREAST IMPLANT REMOVAL Left 11/10/2022   Procedure: REMOVAL BREAST IMPLANTS;  Surgeon: Thimmappa, Brinda, MD;  Location: [facility];  Service: Plastics;  Laterality: Left;   BREAST SURGERY     reconstructive breast surgery on left after mastectomy   MASTECTOMY Left 2002   TISSUE EXPANDER PLACEMENT Left 11/10/2022   Procedure: TISSUE EXPANDER PLACEMENT;  Surgeon: Thimmappa, Brinda, MD;  Location:  SURGERY CENTER;  Service: Plastics;  Laterality:  Left;   TUBAL LIGATION      Family History  Problem Relation Age of Onset   Hypertension Mother    Diabetes Mother    Hypertension Father     Social History   Tobacco Use   Smoking status: Never   Smokeless tobacco: Never  Substance Use Topics   Alcohol use: No   Drug use: No    Medications: I have reviewed the patient's current medications. Allergies as of 05/31/2023   No Known Allergies      Medication List        Accurate as of May 31, 2023 10:35 AM. If you have any questions, ask your nurse or doctor.          acetaminophen 500 MG tablet Commonly known as: TYLENOL Take 500 mg by mouth every 6 (six) hours as needed.   Cholecalciferol 50 MCG (2000 UT) Tabs Take 1 tablet by mouth every evening.   letrozole 2.5 MG tablet Commonly known as: FEMARA TAKE 1 TABLET BY MOUTH EVERY DAY   meloxicam 7.5 MG tablet Commonly known as: Mobic Take 1-2 tablets (7.5-15 mg total) by mouth daily.         ROS:  Constitutional: negative for chills, fatigue, and fevers Eyes: negative for visual disturbance and pain Ears, nose, mouth, throat, and face: positive for sinus problems, negative for ear drainage and sore throat Respiratory: negative for cough, wheezing, and shortness of breath Cardiovascular: negative for chest pain and palpitations Gastrointestinal: negative for abdominal pain,   nausea, reflux symptoms, and vomiting Genitourinary:negative for dysuria and frequency Integument/breast: negative for dryness and rash Hematologic/lymphatic: negative for bleeding and lymphadenopathy Musculoskeletal:positive for back pain and neck pain Neurological: negative for dizziness and tremors Endocrine: negative for temperature intolerance  Blood pressure 132/85, pulse (!) 103, temperature 98.1 F (36.7 C), temperature source Oral, resp. rate 12, height 5' 5" (1.651 m), weight 227 lb (103 kg), SpO2 96 %. Physical Exam Constitutional:      Appearance: Normal appearance.   HENT:     Head: Normocephalic and atraumatic.  Eyes:     Extraocular Movements: Extraocular movements intact.     Pupils: Pupils are equal, round, and reactive to light.  Cardiovascular:     Rate and Rhythm: Normal rate and regular rhythm.  Pulmonary:     Effort: Pulmonary effort is normal.     Breath sounds: Normal breath sounds.  Abdominal:     Comments: Abdomen soft, nondistended, no percussion tenderness, nontender to palpation; no rigidity, guarding, rebound tenderness  Musculoskeletal:        General: Normal range of motion.     Cervical back: Normal range of motion.  Skin:    General: Skin is warm and dry.  Neurological:     General: No focal deficit present.     Mental Status: She is alert and oriented to person, place, and time.  Psychiatric:        Mood and Affect: Mood normal.        Behavior: Behavior normal.     Results: No results found for this or any previous visit (from the past 48 hour(s)).  No results found.   Assessment & Plan:  Alexis Webb is a 56 y.o. female who presents to discuss colonoscopy.  -I discussed the recommendations for colon cancer screening, and the utility of colonoscopy with colon cancer screening.  Given her history of colon polyps and the recommendation for repeat colonoscopy at 5 years, she is due for her next colonoscopy. -The risk and benefits of colonoscopy were discussed including but not limited to bleeding, infection, missed lesions, and perforation requiring surgery.  After careful consideration, Alexis Webb has decided to proceed with colonoscopy. -Patient tentatively scheduled for colonoscopy on 7/23 -Prescription sent in for Sutab prep.  Explained Sutab prep to the patient and provided information and coupon for this prep -Provided information regarding colonoscopies  All questions were answered to the satisfaction of the patient and family.  Lev Cervone, DO [facility] [address] [place], [place] [address] [phone] (office) 

## 2023-06-01 DIAGNOSIS — Z6837 Body mass index (BMI) 37.0-37.9, adult: Secondary | ICD-10-CM | POA: Diagnosis not present

## 2023-06-01 DIAGNOSIS — R030 Elevated blood-pressure reading, without diagnosis of hypertension: Secondary | ICD-10-CM | POA: Diagnosis not present

## 2023-06-01 DIAGNOSIS — E538 Deficiency of other specified B group vitamins: Secondary | ICD-10-CM | POA: Diagnosis not present

## 2023-06-08 DIAGNOSIS — E538 Deficiency of other specified B group vitamins: Secondary | ICD-10-CM | POA: Diagnosis not present

## 2023-06-08 DIAGNOSIS — Z6837 Body mass index (BMI) 37.0-37.9, adult: Secondary | ICD-10-CM | POA: Diagnosis not present

## 2023-06-20 DIAGNOSIS — M1712 Unilateral primary osteoarthritis, left knee: Secondary | ICD-10-CM | POA: Diagnosis not present

## 2023-06-26 ENCOUNTER — Encounter (HOSPITAL_COMMUNITY)
Admission: RE | Disposition: A | Discharge status: Home / Self Care | Payer: Self-pay | Source: Ambulatory Visit | Attending: Surgery

## 2023-06-26 ENCOUNTER — Ambulatory Visit (HOSPITAL_BASED_OUTPATIENT_CLINIC_OR_DEPARTMENT_OTHER): Payer: Medicare HMO | Admitting: Certified Registered"

## 2023-06-26 ENCOUNTER — Ambulatory Visit (HOSPITAL_COMMUNITY): Payer: Medicare HMO | Admitting: Certified Registered"

## 2023-06-26 ENCOUNTER — Ambulatory Visit (HOSPITAL_COMMUNITY)
Admission: RE | Admit: 2023-06-26 | Discharge: 2023-06-26 | Disposition: A | Discharge status: Home / Self Care | Payer: Medicare HMO | Source: Ambulatory Visit | Attending: Surgery | Admitting: Surgery

## 2023-06-26 ENCOUNTER — Encounter (HOSPITAL_COMMUNITY): Payer: Self-pay | Admitting: Surgery

## 2023-06-26 ENCOUNTER — Other Ambulatory Visit: Payer: Self-pay

## 2023-06-26 DIAGNOSIS — K649 Unspecified hemorrhoids: Secondary | ICD-10-CM | POA: Diagnosis not present

## 2023-06-26 DIAGNOSIS — Z853 Personal history of malignant neoplasm of breast: Secondary | ICD-10-CM | POA: Diagnosis not present

## 2023-06-26 DIAGNOSIS — D127 Benign neoplasm of rectosigmoid junction: Secondary | ICD-10-CM

## 2023-06-26 DIAGNOSIS — D649 Anemia, unspecified: Secondary | ICD-10-CM | POA: Diagnosis not present

## 2023-06-26 DIAGNOSIS — K621 Rectal polyp: Secondary | ICD-10-CM | POA: Diagnosis not present

## 2023-06-26 DIAGNOSIS — K644 Residual hemorrhoidal skin tags: Secondary | ICD-10-CM | POA: Diagnosis not present

## 2023-06-26 DIAGNOSIS — Z860100 Personal history of colon polyps, unspecified: Secondary | ICD-10-CM

## 2023-06-26 DIAGNOSIS — K635 Polyp of colon: Secondary | ICD-10-CM | POA: Diagnosis not present

## 2023-06-26 DIAGNOSIS — Z1211 Encounter for screening for malignant neoplasm of colon: Secondary | ICD-10-CM | POA: Insufficient documentation

## 2023-06-26 HISTORY — PX: COLONOSCOPY WITH PROPOFOL: SHX5780

## 2023-06-26 HISTORY — PX: POLYPECTOMY: SHX5525

## 2023-06-26 SURGERY — COLONOSCOPY WITH PROPOFOL
Anesthesia: General

## 2023-06-26 MED ORDER — LACTATED RINGERS IV SOLN: INTRAVENOUS | Status: DC | PRN

## 2023-06-26 MED ORDER — PROPOFOL 500 MG/50ML IV EMUL
INTRAVENOUS | Status: DC | PRN
  Administered 2023-06-26: 150 ug/kg/min via INTRAVENOUS

## 2023-06-26 MED ORDER — LACTATED RINGERS IV SOLN: INTRAVENOUS | Status: DC

## 2023-06-26 MED ORDER — PROPOFOL 10 MG/ML IV BOLUS
INTRAVENOUS | Status: DC | PRN
Start: 2023-06-26 — End: 2023-06-26
  Administered 2023-06-26 (×3): 50 mg via INTRAVENOUS
  Administered 2023-06-26: 100 mg via INTRAVENOUS
  Administered 2023-06-26: 50 mg via INTRAVENOUS

## 2023-06-26 MED ORDER — LIDOCAINE HCL (CARDIAC) PF 100 MG/5ML IV SOSY
PREFILLED_SYRINGE | INTRAVENOUS | Status: DC | PRN
  Administered 2023-06-26: 50 mg via INTRAVENOUS

## 2023-06-26 NOTE — Transfer of Care (Signed)
Immediate Anesthesia Transfer of Care Note  Patient: Alexis Webb  Procedure(s) Performed: COLONOSCOPY WITH PROPOFOL POLYPECTOMY  Patient Location: Endoscopy Unit  Anesthesia Type:General  Level of Consciousness: awake  Airway & Oxygen Therapy: Patient Spontanous Breathing  Post-op Assessment: Report given to RN and Post -op Vital signs reviewed and stable  Post vital signs: Reviewed and stable  Last Vitals:  Vitals Value Taken Time  BP    Temp    Pulse    Resp    SpO2      Last Pain:  Vitals:   06/26/23 0819  TempSrc:   PainSc: 0-No pain      Patients Stated Pain Goal: 6 (06/26/23 0655)  Complications: No notable events documented.

## 2023-06-26 NOTE — Op Note (Signed)
[facility] Patient Name: Alexis Webb Procedure Date: 06/26/2023 8:16 AM MRN: [id number] Date of Birth: [birth year] Attending MD: Gustavus Messing Cagney Degrace DO, ,  CSN: [id number] Age: 57 Admit Type: Outpatient Procedure:                Colonoscopy Indications:              High risk colon cancer surveillance: Personal                            history of adenoma less than 10 mm in size Providers:                Gustavus Messing. Tyera Hansley DO, Angelica Ran, Zena Amos Referring MD:              Medicines:                Monitored Anesthesia Care Complications:            No immediate complications. Estimated Blood Loss:     Estimated blood loss: none. Procedure:                Pre-Anesthesia Assessment:                           - Monitored anesthesia care under the supervision                            of a CRNA was determined to be medically necessary                            for this procedure based on review of the patient's                            medical history, medications, and prior anesthesia                            history.                           After obtaining informed consent, the colonoscope                            was passed under direct vision. Throughout the                            procedure, the patient's blood pressure, pulse, and                            oxygen saturations were monitored continuously. The                            [id number]-HQ190L ([id number]) scope was introduced through                            the anus and advanced to the  the cecum, identified                            by appendiceal orifice and ileocecal valve. The                            colonoscopy was performed without difficulty. The                            patient tolerated the procedure well. The quality                            of the bowel preparation was evaluated using the                            BBPS [facility] Bowel Preparation  Scale) with scores                            of: Right Colon = 2 (minor amount of residual                            staining, small fragments of stool and/or opaque                            liquid, but mucosa seen well), Transverse Colon = 3                            (entire mucosa seen well with no residual staining,                            small fragments of stool or opaque liquid) and Left                            Colon = 3 (entire mucosa seen well with no residual                            staining, small fragments of stool or opaque                            liquid). The total BBPS score equals 8. The                            ileocecal valve, the appendiceal orifice and the                            rectum were photographed. Scope insertion time was                            10 minutes. Scope withdrawal time was 15 minutes.                            The total duration of the procedure was 25 minutes. Scope In: 8:23:48 AM  Scope Out: 8:48:41 AM Scope Withdrawal Time: 0 hours 15 minutes 38 seconds  Total Procedure Duration: 0 hours 24 minutes 53 seconds  Findings:      Hemorrhoids were found on perianal exam.      A 3 mm polyp was found in the recto-sigmoid colon. The polyp was       sessile. The polyp was removed with a cold snare. Resection and       retrieval were complete.      The exam was otherwise without abnormality on direct and retroflexion       views. Impression:               - Hemorrhoids found on perianal exam.                           - One 3 mm polyp at the recto-sigmoid colon,                            removed with a cold snare. Resected and retrieved.                           - The examination was otherwise normal on direct                            and retroflexion views. Moderate Sedation:      An independent trained observer was present and continuously monitored       the patient. Recommendation:           - Discharge patient to home  (ambulatory).                           - Resume regular diet.                           - Continue present medications.                           - Await pathology results.                           - Repeat colonoscopy date to be determined after                            pending pathology results are reviewed for                            surveillance. Procedure Code(s):        --- Professional ---                           [phone], Colonoscopy, flexible; with removal of                            tumor(s), polyp(s), or other lesion(s) by snare                            technique Diagnosis Code(s):        ---  Professional ---                           Z86.010, Personal history of colonic polyps                           D12.7, Benign neoplasm of rectosigmoid junction                           K64.9, Unspecified hemorrhoids CPT copyright 2022 American Medical Association. All rights reserved. The codes documented in this report are preliminary and upon coder review may  be revised to meet current compliance requirements. Santina Evans A Audyn Dimercurio DO,  06/26/2023 8:53:48 AM Number of Addenda: 0

## 2023-06-26 NOTE — Anesthesia Preprocedure Evaluation (Signed)
Anesthesia Evaluation  Patient identified by MRN, date of birth, ID band Patient awake    Reviewed: Allergy & Precautions, H&P , NPO status , Patient's Chart, lab work & pertinent test results, reviewed documented beta blocker date and time   History of Anesthesia Complications (+) PONV and history of anesthetic complications  Airway Mallampati: II  TM Distance: >3 FB Neck ROM: full    Dental no notable dental hx.    Pulmonary neg pulmonary ROS   Pulmonary exam normal breath sounds clear to auscultation       Cardiovascular Exercise Tolerance: Good negative cardio ROS  Rhythm:regular Rate:Normal     Neuro/Psych negative neurological ROS  negative psych ROS   GI/Hepatic negative GI ROS, Neg liver ROS,,,  Endo/Other  negative endocrine ROS    Renal/GU negative Renal ROS  negative genitourinary   Musculoskeletal   Abdominal   Peds  Hematology negative hematology ROS (+) Blood dyscrasia, anemia   Anesthesia Other Findings   Reproductive/Obstetrics negative OB ROS                             Anesthesia Physical Anesthesia Plan  ASA: 2  Anesthesia Plan: General   Post-op Pain Management:    Induction:   PONV Risk Score and Plan: Propofol infusion  Airway Management Planned:   Additional Equipment:   Intra-op Plan:   Post-operative Plan:   Informed Consent: I have reviewed the patients History and Physical, chart, labs and discussed the procedure including the risks, benefits and alternatives for the proposed anesthesia with the patient or authorized representative who has indicated his/her understanding and acceptance.     Dental Advisory Given  Plan Discussed with: CRNA  Anesthesia Plan Comments:        Anesthesia Quick Evaluation

## 2023-06-26 NOTE — Interval H&P Note (Signed)
History and Physical Interval Note:  06/26/2023 7:27 AM  Alexis Webb  has presented today for surgery, with the diagnosis of SCREENING FOR COLON CANCER HISTORY OF COLON POLYPS.  The various methods of treatment have been discussed with the patient and family. After consideration of risks, benefits and other options for treatment, the patient has consented to  Procedure(s): COLONOSCOPY WITH PROPOFOL (N/A) as a surgical intervention.  The patient's history has been reviewed, patient examined, no change in status, stable for surgery.  I have reviewed the patient's chart and labs.  Questions were answered to the patient's satisfaction.     Bryanna Yim A Lonzy Mato

## 2023-06-26 NOTE — Anesthesia Procedure Notes (Signed)
Date/Time: 06/26/2023 8:23 AM  Performed by: Julian Reil, CRNAPre-anesthesia Checklist: Patient identified, Emergency Drugs available, Suction available and Patient being monitored Patient Re-evaluated:Patient Re-evaluated prior to induction Oxygen Delivery Method: Nasal cannula Induction Type: IV induction Placement Confirmation: positive ETCO2

## 2023-06-27 LAB — SURGICAL PATHOLOGY

## 2023-06-28 NOTE — Anesthesia Postprocedure Evaluation (Signed)
Anesthesia Post Note  Patient: Alexis Webb  Procedure(s) Performed: COLONOSCOPY WITH PROPOFOL POLYPECTOMY  Patient location during evaluation: Phase II Anesthesia Type: General Level of consciousness: awake Pain management: pain level controlled Vital Signs Assessment: post-procedure vital signs reviewed and stable Respiratory status: spontaneous breathing and respiratory function stable Cardiovascular status: blood pressure returned to baseline and stable Postop Assessment: no headache and no apparent nausea or vomiting Anesthetic complications: no Comments: Late entry   No notable events documented.   Last Vitals:  Vitals:   06/26/23 0708 06/26/23 0851  BP: 129/79 (!) 103/57  Pulse: 93 91  Resp: 13 20  Temp: 36.9 C 36.8 C  SpO2: 97% 98%    Last Pain:  Vitals:   06/26/23 0851  TempSrc: Oral  PainSc: 0-No pain                 Windell Norfolk

## 2023-06-29 ENCOUNTER — Encounter (HOSPITAL_COMMUNITY): Payer: Self-pay | Admitting: Surgery

## 2023-06-29 ENCOUNTER — Telehealth (INDEPENDENT_AMBULATORY_CARE_PROVIDER_SITE_OTHER): Payer: Medicare HMO | Admitting: Surgery

## 2023-06-29 DIAGNOSIS — K635 Polyp of colon: Secondary | ICD-10-CM

## 2023-06-29 NOTE — Telephone Encounter (Signed)
[facility]  Called to update the patient regarding the results of her colon polyp pathology from colonoscopy on 7/23.  Patient did not answer the phone, so I left a voicemail.  I explained that the polyp was a hyperplastic, benign polyp.  For this reason, she can start undergoing screening every 10 years for her colonoscopies.  Advised to call the office if she has questions or concerns.  Pathology: A. COLON, RECTOSIDMOID, POLYPECTOMY:  Hyperplastic polyp   Theophilus Kinds, DO [facility] [address] [place], [place] [address] [phone] (office)

## 2023-07-13 DIAGNOSIS — R030 Elevated blood-pressure reading, without diagnosis of hypertension: Secondary | ICD-10-CM | POA: Diagnosis not present

## 2023-07-13 DIAGNOSIS — E538 Deficiency of other specified B group vitamins: Secondary | ICD-10-CM | POA: Diagnosis not present

## 2023-08-13 DIAGNOSIS — M5416 Radiculopathy, lumbar region: Secondary | ICD-10-CM | POA: Diagnosis not present

## 2023-08-29 DIAGNOSIS — M170 Bilateral primary osteoarthritis of knee: Secondary | ICD-10-CM | POA: Diagnosis not present

## 2023-08-29 DIAGNOSIS — G8929 Other chronic pain: Secondary | ICD-10-CM | POA: Diagnosis not present

## 2023-09-11 ENCOUNTER — Other Ambulatory Visit (HOSPITAL_COMMUNITY): Payer: Self-pay | Admitting: Physician Assistant

## 2023-09-11 DIAGNOSIS — N644 Mastodynia: Secondary | ICD-10-CM

## 2023-09-24 ENCOUNTER — Ambulatory Visit (HOSPITAL_COMMUNITY): Payer: Medicare HMO

## 2023-10-11 IMAGING — MR MR LUMBAR SPINE W/O CM
5 series · 30 of 48 positions shown · non-contrast
Comparison: MRI lumbar spine October 06, 2020.

CLINICAL DATA: Lumbar radiculopathy AHV.BW ([id number])

EXAM:
MRI LUMBAR SPINE WITHOUT CONTRAST
TECHNIQUE: Multiplanar, multisequence MR imaging of the lumbar spine was
performed. No intravenous contrast was administered.

[Series 5: T2 · sagittal · 4.0mm · 0.68mm/px · 5 of 13 slices shown (1 of 2)]
[im 1/13]
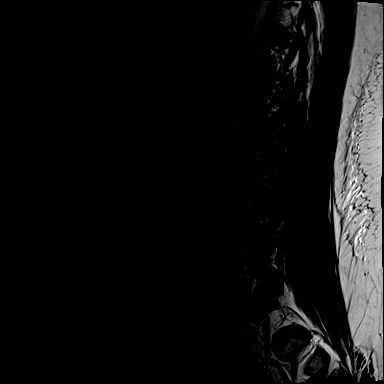
[im 4/13]
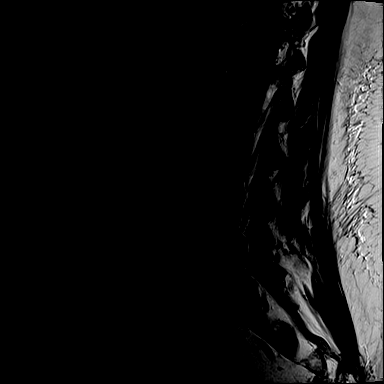
[im 7/13]
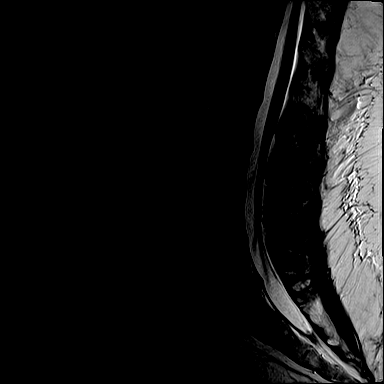
[im 10/13]
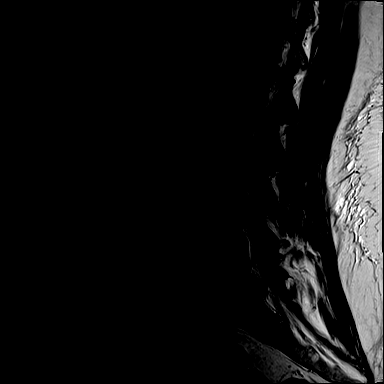
[im 13/13]
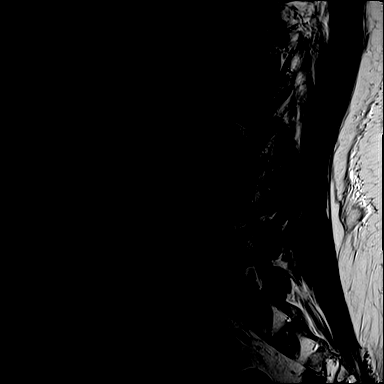

[Series 6: T1 · sagittal · 4.0mm · 0.81mm/px · 6 of 13 slices shown (1 of 2)]
[im 1/13]
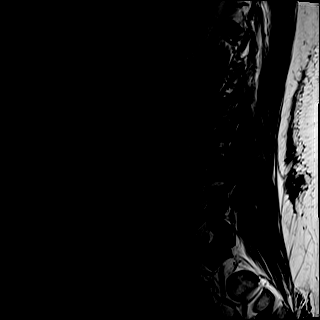
[im 3/13]
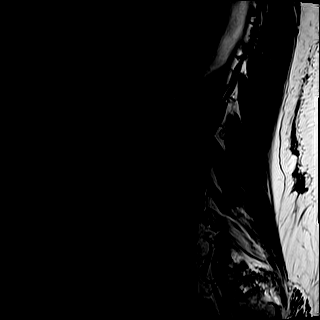
[im 5/13]
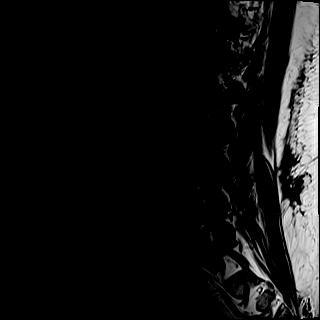
[im 8/13]
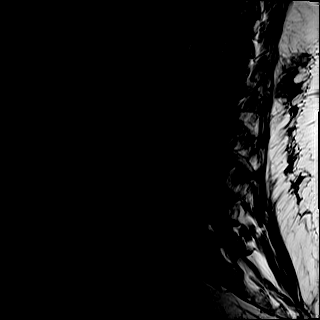
[im 10/13]
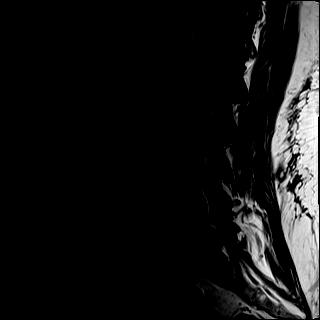
[im 13/13]
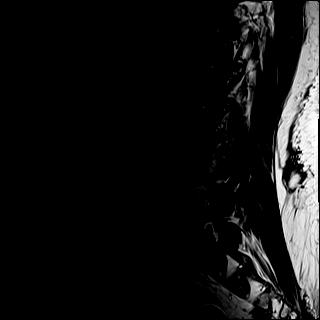

[Series 7: STIR · sagittal · 4.0mm · 0.51mm/px · 1 of 15 slices shown]
[im 1/15]
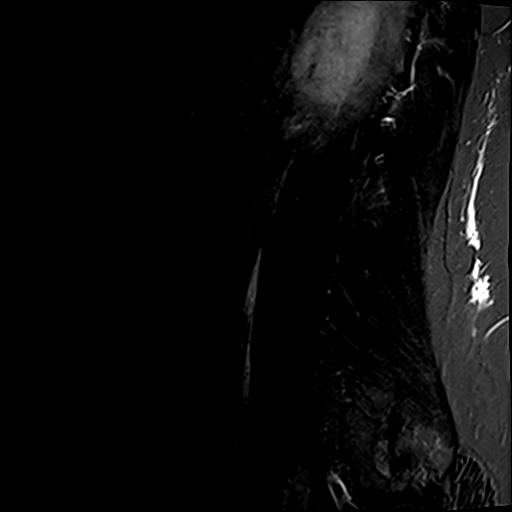

[Series 8: T2 · axial · 4.0mm · 0.70mm/px · z∈[-93,+113]mm · 9 of 33 slices shown (2 of 2)]
[im 1/33]
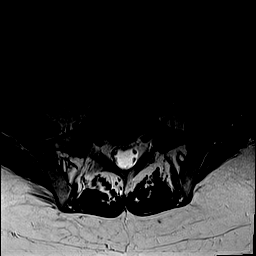
[im 5/33]
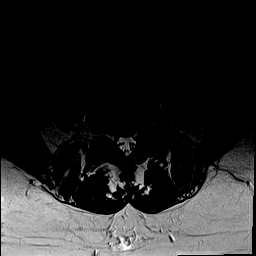
[im 10/33]
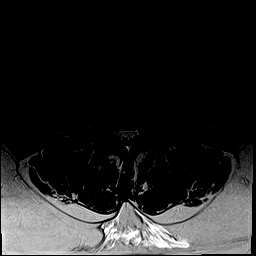
[im 14/33]
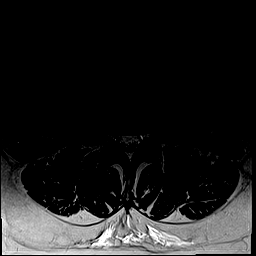
[im 17/33]
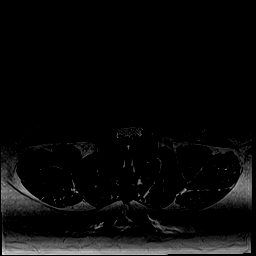
[im 19/33]
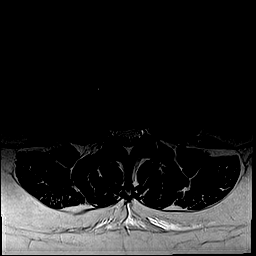
[im 23/33]
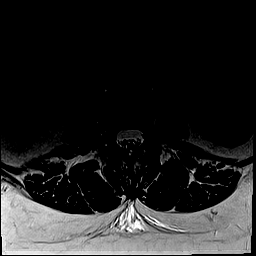
[im 28/33]
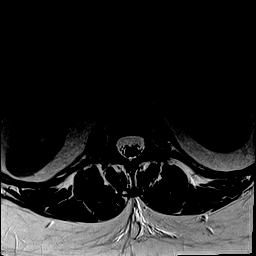
[im 33/33]
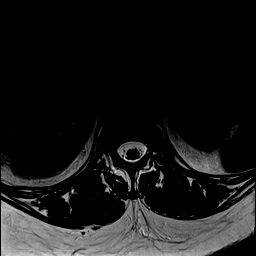

[Series 9: T1 · axial · 4.0mm · 0.35mm/px · z∈[-93,+113]mm · 9 of 33 slices shown (2 of 2)]
[im 1/33]
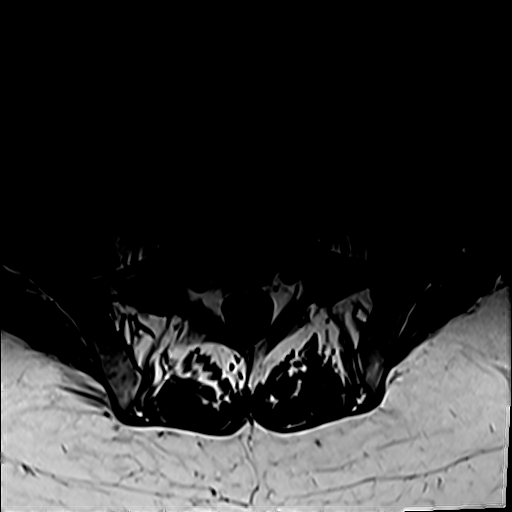
[im 5/33]
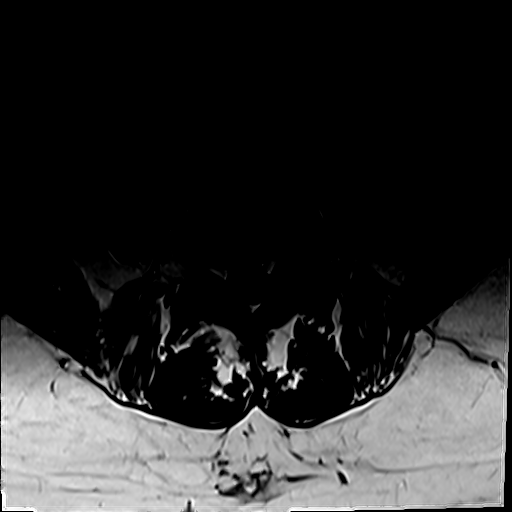
[im 10/33]
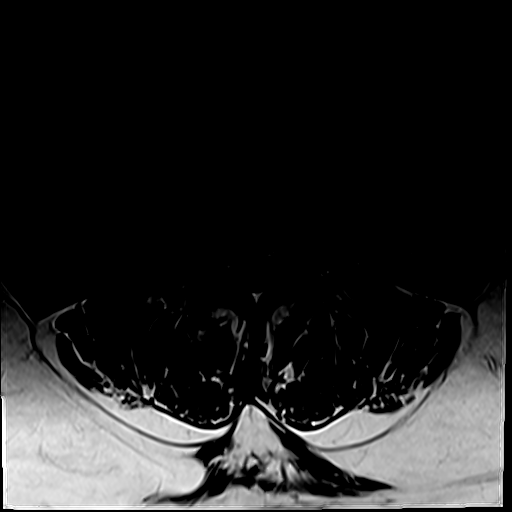
[im 14/33]
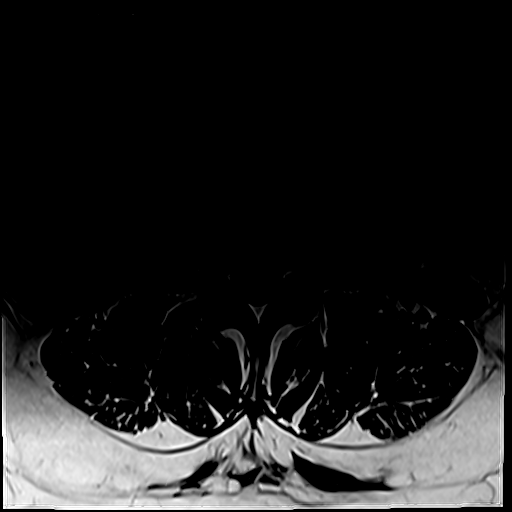
[im 17/33]
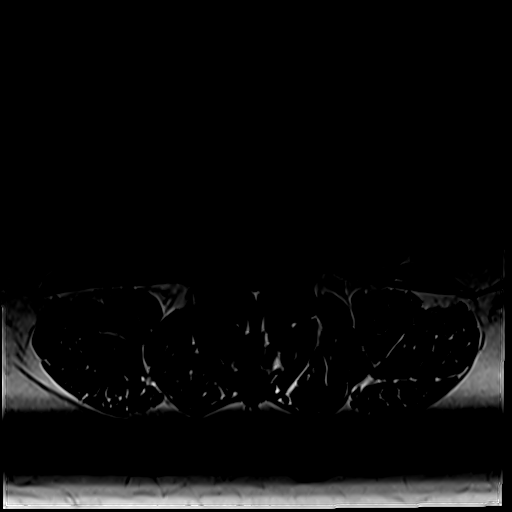
[im 19/33]
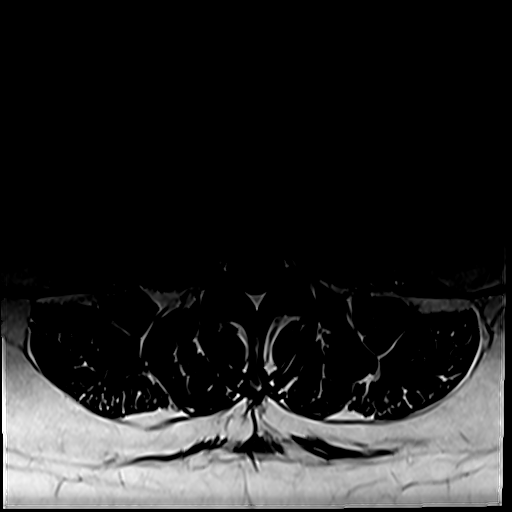
[im 23/33]
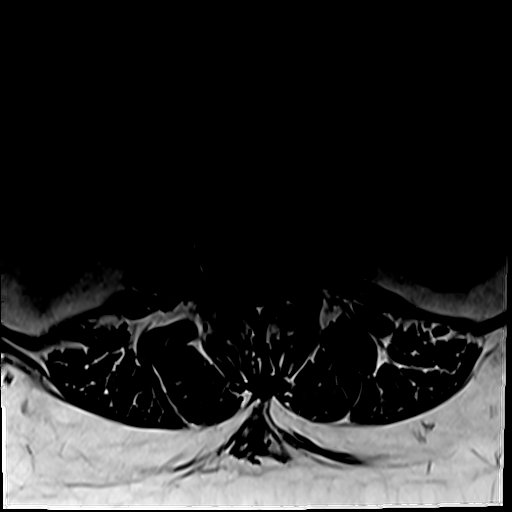
[im 28/33]
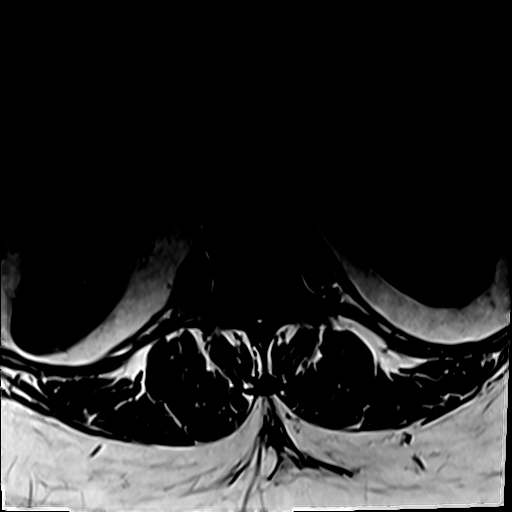
[im 33/33]
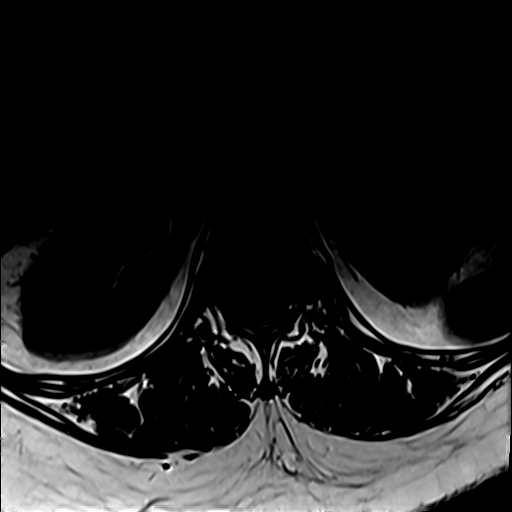

[30 of 48 positions shown; findings below may reference images not displayed]

FINDINGS: Segmentation:  Standard.

Alignment:  Physiologic.

Vertebrae:  No fracture, evidence of discitis, or bone lesion.

Conus medullaris and cauda equina: Conus extends to the L2 level.
Conus and cauda equina appear normal.

Paraspinal and other soft tissues: Negative.

Disc levels:

T12-L1: No spinal canal or neural foraminal stenosis.

L1-2: Minimal disc bulge and mild facet degenerative changes without
significant spinal canal or neural foraminal stenosis.

L2-3: Shallow disc bulge and mild facet degenerative changes without
significant spinal canal or neural foraminal stenosis.

L3-4: Shallow disc bulge and mild facet degenerative changes without
significant spinal canal or neural foraminal stenosis.

L4-5: Shallow disc bulge, mildly asymmetric to the left, mild facet
degenerative changes and ligamentum flavum redundancy resulting in
mild left neural foraminal narrowing. No significant spinal canal
stenosis.

L5-S1: Neck disc bulge with superimposed central disc protrusion
slightly asymmetric to the left abutting the traversing left S1
nerve root. Moderate facet degenerative changes, left greater than
right. Mild left neural foraminal narrowing. No significant spinal
canal stenosis.

When compared to prior MRI, no significant changes are noted.
IMPRESSION: 1. Stable mild degenerative changes of the lumbar spine.
2. Mild left neural foraminal narrowing at L4-5 and L5-S1.
3. No high-grade spinal canal or neural foraminal stenosis at any
level.

## 2023-10-16 ENCOUNTER — Ambulatory Visit (HOSPITAL_COMMUNITY)
Admission: RE | Admit: 2023-10-16 | Discharge: 2023-10-16 | Disposition: A | Discharge status: Home / Self Care | Payer: Medicare HMO | Source: Ambulatory Visit | Attending: Physician Assistant | Admitting: Physician Assistant

## 2023-10-16 ENCOUNTER — Ambulatory Visit (HOSPITAL_COMMUNITY)
Admission: RE | Admit: 2023-10-16 | Discharge: 2023-10-16 | Disposition: A | Discharge status: Home / Self Care | Payer: Medicare HMO | Source: Ambulatory Visit | Attending: Physician Assistant

## 2023-10-16 ENCOUNTER — Encounter (HOSPITAL_COMMUNITY): Payer: Self-pay

## 2023-10-16 ENCOUNTER — Other Ambulatory Visit (HOSPITAL_COMMUNITY): Payer: Self-pay | Admitting: Physician Assistant

## 2023-10-16 DIAGNOSIS — N644 Mastodynia: Secondary | ICD-10-CM

## 2023-10-16 DIAGNOSIS — R92321 Mammographic fibroglandular density, right breast: Secondary | ICD-10-CM | POA: Diagnosis not present

## 2023-10-16 DIAGNOSIS — Z853 Personal history of malignant neoplasm of breast: Secondary | ICD-10-CM | POA: Diagnosis not present

## 2023-10-16 HISTORY — DX: Personal history of irradiation: Z92.3

## 2023-11-07 ENCOUNTER — Inpatient Hospital Stay: Payer: Medicare HMO | Attending: Hematology

## 2023-11-07 DIAGNOSIS — Z923 Personal history of irradiation: Secondary | ICD-10-CM | POA: Insufficient documentation

## 2023-11-07 DIAGNOSIS — Z9012 Acquired absence of left breast and nipple: Secondary | ICD-10-CM | POA: Diagnosis not present

## 2023-11-07 DIAGNOSIS — Z800 Family history of malignant neoplasm of digestive organs: Secondary | ICD-10-CM | POA: Diagnosis not present

## 2023-11-07 DIAGNOSIS — D649 Anemia, unspecified: Secondary | ICD-10-CM | POA: Insufficient documentation

## 2023-11-07 DIAGNOSIS — Z8042 Family history of malignant neoplasm of prostate: Secondary | ICD-10-CM | POA: Insufficient documentation

## 2023-11-07 DIAGNOSIS — Z853 Personal history of malignant neoplasm of breast: Secondary | ICD-10-CM | POA: Insufficient documentation

## 2023-11-07 DIAGNOSIS — Z803 Family history of malignant neoplasm of breast: Secondary | ICD-10-CM | POA: Insufficient documentation

## 2023-11-07 DIAGNOSIS — Z79899 Other long term (current) drug therapy: Secondary | ICD-10-CM | POA: Diagnosis not present

## 2023-11-07 DIAGNOSIS — Z8051 Family history of malignant neoplasm of kidney: Secondary | ICD-10-CM | POA: Diagnosis not present

## 2023-11-07 DIAGNOSIS — D0511 Intraductal carcinoma in situ of right breast: Secondary | ICD-10-CM

## 2023-11-07 DIAGNOSIS — Z79811 Long term (current) use of aromatase inhibitors: Secondary | ICD-10-CM | POA: Insufficient documentation

## 2023-11-07 DIAGNOSIS — Z86000 Personal history of in-situ neoplasm of breast: Secondary | ICD-10-CM | POA: Diagnosis not present

## 2023-11-07 DIAGNOSIS — N644 Mastodynia: Secondary | ICD-10-CM | POA: Insufficient documentation

## 2023-11-07 DIAGNOSIS — M8580 Other specified disorders of bone density and structure, unspecified site: Secondary | ICD-10-CM | POA: Diagnosis not present

## 2023-11-07 LAB — CBC WITH DIFFERENTIAL/PLATELET
Abs Immature Granulocytes: 0.03 10*3/uL (ref 0.00–0.07)
Basophils Absolute: 0 10*3/uL (ref 0.0–0.1)
Basophils Relative: 0 %
Eosinophils Absolute: 0.1 10*3/uL (ref 0.0–0.5)
Eosinophils Relative: 1 %
HCT: 37.1 % (ref 36.0–46.0)
Hemoglobin: 11.5 g/dL — ABNORMAL LOW (ref 12.0–15.0)
Immature Granulocytes: 0 %
Lymphocytes Relative: 21 %
Lymphs Abs: 1.9 10*3/uL (ref 0.7–4.0)
MCH: 28.6 pg (ref 26.0–34.0)
MCHC: 31 g/dL (ref 30.0–36.0)
MCV: 92.3 fL (ref 80.0–100.0)
Monocytes Absolute: 0.4 10*3/uL (ref 0.1–1.0)
Monocytes Relative: 4 %
Neutro Abs: 6.9 10*3/uL (ref 1.7–7.7)
Neutrophils Relative %: 74 %
Platelets: 243 10*3/uL (ref 150–400)
RBC: 4.02 MIL/uL (ref 3.87–5.11)
RDW: 14.8 % (ref 11.5–15.5)
WBC: 9.3 10*3/uL (ref 4.0–10.5)
nRBC: 0 % (ref 0.0–0.2)

## 2023-11-07 LAB — IRON AND TIBC
Iron: 52 ug/dL (ref 28–170)
Saturation Ratios: 15 % (ref 10.4–31.8)
TIBC: 355 ug/dL (ref 250–450)
UIBC: 303 ug/dL

## 2023-11-07 LAB — COMPREHENSIVE METABOLIC PANEL
ALT: 10 U/L (ref 0–44)
AST: 19 U/L (ref 15–41)
Albumin: 3.6 g/dL (ref 3.5–5.0)
Alkaline Phosphatase: 123 U/L (ref 38–126)
Anion gap: 9 (ref 5–15)
BUN: 15 mg/dL (ref 6–20)
CO2: 28 mmol/L (ref 22–32)
Calcium: 9.1 mg/dL (ref 8.9–10.3)
Chloride: 106 mmol/L (ref 98–111)
Creatinine, Ser: 0.79 mg/dL (ref 0.44–1.00)
GFR, Estimated: >60 mL/min (ref >60)
Glucose, Bld: 117 mg/dL — ABNORMAL HIGH (ref 70–99)
Potassium: 3.4 mmol/L — ABNORMAL LOW (ref 3.5–5.1)
Sodium: 143 mmol/L (ref 135–145)
Total Bilirubin: 0.6 mg/dL (ref <1.2)
Total Protein: 7.5 g/dL (ref 6.5–8.1)

## 2023-11-07 LAB — FERRITIN: Ferritin: 91 ng/mL (ref 11–307)

## 2023-11-07 LAB — VITAMIN D 25 HYDROXY (VIT D DEFICIENCY, FRACTURES): Vit D, 25-Hydroxy: 33.98 ng/mL (ref 30–100)

## 2023-11-13 NOTE — Progress Notes (Unsigned)
[facility] 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 + Femara 2.5 mg daily started 07/09/2018   CURRENT THERAPY: Femara 2.5 mg daily  BRIEF ONCOLOGIC HISTORY:   Oncology History  DCIS (ductal carcinoma in situ)  05/14/2018 Cancer Staging   Staging form: Breast, AJCC 8th Edition - Clinical stage from 05/14/2018: Stage 0 (cTis (DCIS), cN0, cM0, ER+, PR+, HER2-) - Signed by Doreatha Massed, MD on 09/12/2021 Nuclear grade: G1   09/12/2021 Initial Diagnosis   DCIS (ductal carcinoma in situ)     CANCER STAGING:  Cancer Staging  DCIS (ductal carcinoma in situ) Staging form: Breast, AJCC 8th Edition - Clinical stage from 05/14/2018: Stage 0 (cTis (DCIS), cN0, cM0, ER+, PR+, HER2-) - Signed by Doreatha Massed, MD on 09/12/2021   INTERVAL HISTORY:   Ms. Alexis Webb, a 57 y.o. female, returns for routine follow-up of her right breast DCIS (2019) remote history of left breast invasive carcinoma (2002). Alexis Webb was last seen on 05/18/2023 by Rojelio Brenner PA-C.   At today's visit, she  reports feeling well.  She denies any recent hospitalizations, surgeries, or changes in her  baseline health status.  She complains of ongoing tenderness along the lateral aspect of her right breast which has been present since lumpectomy.  For the past several months she has also noticed some burning and tingling pain in her right axilla, radiating down her right breast and right upper arm.  She denies any injury, infection, decreased range of motion, mass, or  lymphadenopathy.  She denies any symptoms of recurrence such as new lumps, bone pain, chest pain, dyspnea, or abdominal pain.  She has no new headaches, seizures, or focal neurologic deficits.   No B symptoms such as fever, chills, night sweats, unintentional weight loss.  Letrozole was stopped in September 2024.  She continues to take vitamin D 4000 units daily.  She reports 90% energy and 100% appetite.  She is maintaining stable weight at this time.   ASSESSMENT & PLAN:  1.  Right breast DCIS (2019) - Right breast lumpectomy and SLNB on 05/14/2018 (initially evaluated by Dr. Angelene Giovanni at [facility]) - Pathology shows DCIS.  No evidence of invasive carcinoma.  Margins negative.  0/3 lymph nodes involved.  ER 100%/PR 100%.  HER2 by FISH negative.  - XRT to right breast from 07/15/2018 through 08/06/2018 - Completed 5-year course of letrozole 2.5 mg daily from August 2019 through September 2024. - Most recent diagnostic mammogram (unilateral, right) on 10/16/2023: No mammographic evidence of malignancy in right breast.  (BI-RADS Category 2, benign).  Recommendations for screening mammogram in 1 year. - Physical exam today (11/14/2023)  shows right lumpectomy scar around the areola within normal limits.  There is tenderness in the medial part of the breast, with no clearly palpable mass.  Left breast exam within normal limits s/p mastectomy and breast implant. - Patient history/ROS today negative for any "red flag" symptoms of breast cancer recurrence - Most recent labs (11/07/2023) shows normal LFTs and kidney function.  CBC unremarkable. - PLAN: Letrozole discontinued, has completed 5 years  of therapy.   - Recommend switching to annual office visits at this time.   - Screening mammogram of RIGHT breast due 10/16/2024 - Labs in 1 year = CBC/D, CMP, vitamin D, ferritin, iron/TIBC - OFFICE visit in 1 year (1 week after labs)  2.  Left breast invasive carcinoma (2002) - Reportedly diagnosed in  2002.  I do not have records to review. - She underwent a lumpectomy followed by mastectomy, followed by implant.  She received AC followed by T chemotherapy.  This was followed by radiation therapy.  No adjuvant antiestrogen therapy was given. - Recently underwent removal and replacement of left breast implant on 11/10/2022.     3.  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 - She is taking vitamin D 4000 units daily.  Unable to take calcium due to constipation. - Most recent vitamin D (11/07/2023): 33.98 - PLAN: We will plan on repeat DEXA scan in May 2025 (every 2 years while on aromatase inhibitor) - Continue vitamin D supplementation 4000 units daily    4.  Neuropathic breast pain s/p lumpectomy - Ongoing tenderness along the lateral aspect of her right breast which has been present since lumpectomy.  For the past several months she has also noticed some burning and tingling pain in her right axilla, radiating down her right breast and right upper arm.  She denies any injury, infection, decreased range of motion, mass, or lymphadenopathy. - PLAN: Prescription for duloxetine (Cymbalta) 30 mg daily x 1 week as tested dose, will increase to therapeutic dose (60 mg daily) starting in week #2 if tolerating well.  5.  High risk family and personal history - Genetic testing was done on 04/23/2018 at [facility].  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    6.  Social and family history - She worked in [employer] and lost her job in July 2022.  She is non-smoker. - Father had kidney cancer.  Paternal uncle had "stomach cancer".  Paternal aunt had breast cancer in her 41s.  Another paternal uncle had prostate cancer.  Another paternal uncle had pancreatic cancer.  Paternal grandmother also had pancreatic cancer. Paternal first cousin had breast cancer.   PLAN SUMMARY: >> Rx for duloxetine sent to  pharmacy >> PHONE visit (no labs) in 2 months >> Bone density scan due May 2025 - will watch for results and CALL patient to discuss - if results warrant to treatment, will schedule office visit to discuss >> Mammogram due 10/16/2024 >> Labs in 1 year = CBC/D, CMP, vitamin D, ferritin, iron/TIBC >> OFFICE visit in 1 year (1 week after labs/mammogram)    REVIEW OF SYSTEMS:   Review of Systems  Constitutional:  Negative for appetite change, chills, diaphoresis, fatigue, fever and unexpected weight change.  HENT:   Negative for lump/mass and nosebleeds.   Eyes:  Negative for eye problems.  Respiratory:  Negative for cough, hemoptysis and shortness of breath.   Cardiovascular:  Negative for chest pain, leg swelling and palpitations.  Gastrointestinal:  Negative for abdominal pain, blood in stool, constipation, diarrhea, nausea and vomiting.  Genitourinary:  Negative for hematuria.   Musculoskeletal:  Positive for arthralgias.  Skin: Negative.   Neurological:  Negative for dizziness, headaches and light-headedness.  Hematological:  Does not bruise/bleed easily.    PHYSICAL EXAM:   Performance status (ECOG): 0 - Asymptomatic  Vitals:   11/14/23 9147  BP: 129/81  Pulse: 96  Resp: 18  Temp: 97.7 F (36.5 C)  SpO2: 100%   Wt Readings from Last 3 Encounters:  11/14/23 233 lb 9.6 oz (106 kg)  06/26/23 225 lb (102.1 kg)  05/31/23 227 lb (103 kg)   Physical Exam Constitutional:      Appearance: Normal appearance. She is obese.  Cardiovascular:     Heart sounds: Normal heart sounds.  Pulmonary:     Breath sounds: Normal breath sounds.  Chest:       Comments: Right lumpectomy scar around the areola within normal limits.  Left breast exam within normal limits s/p mastectomy and breast implant.  No discrete nodules, masses, or lymphadenopathy bilaterally. Lymphadenopathy:     Upper Body:     Right upper body: No supraclavicular, axillary or pectoral adenopathy.     Left upper  body: No supraclavicular, axillary or pectoral adenopathy.  Neurological:     General: No focal deficit present.     Mental Status: Mental status is at baseline.  Psychiatric:        Behavior: Behavior normal. Behavior is cooperative.      PAST MEDICAL/SURGICAL HISTORY:  Past Medical History:  Diagnosis Date   Anemia    years ago per pt   Cancer (HCC) 2002   left breast, 2019 right breast lymph nodes removed   Fatty liver    Personal history of radiation therapy    PONV (postoperative nausea and vomiting)    Past Surgical History:  Procedure Laterality Date   ANTERIOR CERVICAL DECOMP/DISCECTOMY FUSION N/A 10/13/2016   Procedure: C5-6, C6-7 Anterior Cervical Discectomy and Fusion, Allograft, Plate;  Surgeon: Eldred Manges, MD;  Location: [facility] OR;  Service: Orthopedics;  Laterality: N/A;   BREAST IMPLANT REMOVAL Left 11/10/2022   Procedure: REMOVAL BREAST IMPLANTS;  Surgeon: Glenna Fellows, MD;  Location: [facility];  Service: Plastics;  Laterality: Left;   BREAST SURGERY     reconstructive breast surgery on left after mastectomy   COLONOSCOPY WITH PROPOFOL N/A 06/26/2023   Procedure: COLONOSCOPY WITH PROPOFOL;  Surgeon: Lewie Chamber, DO;  Location: [facility];  Service: General;  Laterality: N/A;   MASTECTOMY Left 2002   POLYPECTOMY  06/26/2023   Procedure: POLYPECTOMY;  Surgeon: Lewie Chamber, DO;  Location: [facility];  Service: General;;   TISSUE EXPANDER PLACEMENT Left 11/10/2022   Procedure: TISSUE EXPANDER PLACEMENT;  Surgeon: Glenna Fellows, MD;  Location: [facility];  Service: Plastics;  Laterality: Left;   TUBAL LIGATION      SOCIAL HISTORY:  Social History   Socioeconomic History   Marital status: Divorced    Spouse name: Not on file   Number of children: Not on file   Years of education: Not on file   Highest education level: Not on file  Occupational History   Not on file  Tobacco Use   Smoking  status: Never   Smokeless tobacco: Never  Vaping Use   Vaping status: Never Used  Substance and Sexual Activity   Alcohol use: No   Drug use: No   Sexual activity: Not on file  Other Topics Concern   Not on file  Social History Narrative   Not on file   Social Determinants of Health   Financial Resource Strain: Low Risk  (05/02/2018)   Received from [facility], [facility]   Overall Financial Resource Strain (CARDIA)    Difficulty of Paying Living Expenses: Not hard at  all  Food Insecurity: No Food Insecurity (01/12/2021)   Received from [facility], [facility]   Hunger Vital Sign    Worried About Running Out of Food in the Last Year: Never true    Ran Out of Food in the Last Year: Never true  Transportation Needs: No Transportation Needs (05/02/2018)   Received from [facility], [facility]   [facility] - Transportation    Lack of Transportation (Medical): No    Lack of Transportation (Non-Medical): No  Physical Activity: Inactive (05/02/2018)   Received from [facility], [facility]   Exercise Vital Sign    Days of Exercise per Week: 0 days    Minutes of Exercise per Session: 0 min  Stress: No Stress Concern Present (05/02/2018)   Received from [facility], [facility] - Occupational Stress Questionnaire    Feeling of Stress : Not at all  Social Connections: Unknown (04/17/2022)   Received from [facility], [facility]   Social Network    Social Network: Not on file  Intimate Partner Violence: Unknown (03/07/2022)   Received from [facility], [facility]   HITS    Physically Hurt: Not on file    Insult or Talk Down To: Not on file    Threaten Physical Harm: Not on file    Scream or Curse: Not on file    FAMILY HISTORY:  Family History  Problem Relation Age of Onset   Hypertension Mother    Diabetes Mother    Hypertension Father     CURRENT MEDICATIONS:  Current Outpatient  Medications  Medication Sig Dispense Refill   acetaminophen (TYLENOL) 500 MG tablet Take 500 mg by mouth every 6 (six) hours as needed.     Cholecalciferol 50 MCG (2000 UT) TABS Take 4,000 Units by mouth every evening.     DULoxetine (CYMBALTA) 30 MG capsule Take 1 capsule (30 mg total) by mouth daily for 7 days, THEN 2 capsules (60 mg total) daily. 180 capsule 0   meloxicam (MOBIC) 7.5 MG tablet Take 1-2 tablets (7.5-15 mg total) by mouth daily. (Patient taking differently: Take 7.5 mg by mouth daily.) 30 tablet 0   Sodium Sulfate-Mag Sulfate-KCl (SUTAB) [phone] MG TABS Take as directed on the form received at your office visit 24 tablet 0   SSD 1 % cream Apply topically daily.     No current facility-administered medications for this visit.    ALLERGIES:  No Known Allergies  LABORATORY DATA:  I have reviewed the labs as listed.     Latest Ref Rng & Units 11/07/2023   10:48 AM 05/11/2023    8:34 AM 11/06/2022    1:37 PM  CBC  WBC 4.0 - 10.5 K/uL 9.3  7.4  9.0   Hemoglobin 12.0 - 15.0 g/dL 29.5  28.4  13.2   Hematocrit 36.0 - 46.0 % 37.1  37.4  37.0   Platelets 150 - 400 K/uL 243  234  210       Latest Ref Rng & Units 11/07/2023   10:48 AM 05/11/2023    8:34 AM 11/06/2022    1:37 PM  CMP  Glucose 70 - 99 mg/dL 440  102  725   BUN 6 - 20 mg/dL 15  13  13    Creatinine 0.44 - 1.00 mg/dL 3.66  4.40  3.47   Sodium 135 - 145 mmol/L 143  141  139   Potassium 3.5 -  5.1 mmol/L 3.4  4.2  3.5   Chloride 98 - 111 mmol/L 106  105  104   CO2 22 - 32 mmol/L 28  26  26    Calcium 8.9 - 10.3 mg/dL 9.1  8.9  8.7   Total Protein 6.5 - 8.1 g/dL 7.5  7.4  7.6   Total Bilirubin <1.2 mg/dL 0.6  0.8  0.5   Alkaline Phos 38 - 126 U/L 123  118  135   AST 15 - 41 U/L 19  19  22    ALT 0 - 44 U/L 10  12  10      DIAGNOSTIC IMAGING:  I have independently reviewed the scans and discussed with the patient. MM 3D DIAGNOSTIC MAMMOGRAM UNILATERAL RIGHT BREAST  Result Date: 10/16/2023 CLINICAL DATA:   57 year old female with right breast tenderness, initially involving the right axilla and upper-outer right breast and now more intermittent only involving the upper-outer right breast in the region of the lumpectomy scar. History of right breast cancer post lumpectomy 2019 and history of remote left mastectomy. EXAM: DIGITAL DIAGNOSTIC UNILATERAL RIGHT MAMMOGRAM WITH TOMOSYNTHESIS AND CAD TECHNIQUE: Right digital diagnostic mammography and breast tomosynthesis was performed. The images were evaluated with computer-aided detection. COMPARISON:  Previous exam(s). ACR Breast Density Category b: There are scattered areas of fibroglandular density. FINDINGS: No suspicious masses or calcifications are seen in the right breast. There is no mammographic evidence of malignancy in the right breast. IMPRESSION: No mammographic evidence of malignancy in the right breast. RECOMMENDATION: 1. Recommend further management of right breast tenderness be based on clinical assessment. 2.  Screening mammogram in one year.(Code:SM-B-01Y) I have discussed the findings and recommendations with the patient. If applicable, a reminder letter will be sent to the patient regarding the next appointment. BI-RADS CATEGORY  1: Negative. Electronically Signed   By: Edwin Cap M.D.   On: 10/16/2023 15:02     WRAP UP:  All questions were answered. The patient knows to call the clinic with any problems, questions or concerns.  Medical decision making: Moderate  Time spent on visit: I spent 20 minutes counseling the patient face to face. The total time spent in the appointment was 30 minutes and more than 50% was on counseling.  Carnella Guadalajara, PA-C  11/14/23 9:52 AM

## 2023-11-14 ENCOUNTER — Inpatient Hospital Stay: Payer: Medicare HMO | Admitting: Physician Assistant

## 2023-11-14 VITALS — BP 129/81 | HR 96 | Temp 97.7°F | Resp 18 | Ht 65.0 in | Wt 233.6 lb

## 2023-11-14 DIAGNOSIS — D649 Anemia, unspecified: Secondary | ICD-10-CM

## 2023-11-14 DIAGNOSIS — Z9012 Acquired absence of left breast and nipple: Secondary | ICD-10-CM | POA: Diagnosis not present

## 2023-11-14 DIAGNOSIS — N644 Mastodynia: Secondary | ICD-10-CM | POA: Diagnosis not present

## 2023-11-14 DIAGNOSIS — M8580 Other specified disorders of bone density and structure, unspecified site: Secondary | ICD-10-CM

## 2023-11-14 DIAGNOSIS — Z853 Personal history of malignant neoplasm of breast: Secondary | ICD-10-CM | POA: Diagnosis not present

## 2023-11-14 DIAGNOSIS — G629 Polyneuropathy, unspecified: Secondary | ICD-10-CM | POA: Diagnosis not present

## 2023-11-14 DIAGNOSIS — D0511 Intraductal carcinoma in situ of right breast: Secondary | ICD-10-CM

## 2023-11-14 DIAGNOSIS — Z86000 Personal history of in-situ neoplasm of breast: Secondary | ICD-10-CM | POA: Diagnosis not present

## 2023-11-14 DIAGNOSIS — Z79899 Other long term (current) drug therapy: Secondary | ICD-10-CM | POA: Diagnosis not present

## 2023-11-14 DIAGNOSIS — Z8042 Family history of malignant neoplasm of prostate: Secondary | ICD-10-CM | POA: Diagnosis not present

## 2023-11-14 DIAGNOSIS — Z800 Family history of malignant neoplasm of digestive organs: Secondary | ICD-10-CM | POA: Diagnosis not present

## 2023-11-14 DIAGNOSIS — Z803 Family history of malignant neoplasm of breast: Secondary | ICD-10-CM | POA: Diagnosis not present

## 2023-11-14 DIAGNOSIS — Z923 Personal history of irradiation: Secondary | ICD-10-CM | POA: Diagnosis not present

## 2023-11-14 DIAGNOSIS — Z79811 Long term (current) use of aromatase inhibitors: Secondary | ICD-10-CM | POA: Diagnosis not present

## 2023-11-14 DIAGNOSIS — Z8051 Family history of malignant neoplasm of kidney: Secondary | ICD-10-CM | POA: Diagnosis not present

## 2023-11-14 MED ORDER — DULOXETINE HCL 30 MG PO CPEP
ORAL_CAPSULE | ORAL | 0 refills | Status: DC
Start: 2023-11-14 — End: 2024-01-15

## 2023-11-14 NOTE — Patient Instructions (Addendum)
[facility] at [facility] **VISIT SUMMARY & IMPORTANT INSTRUCTIONS **   You were seen today by Rojelio Brenner PA-C for your history of breast cancer.    HISTORY OF BREAST CANCER There were no signs of recurrent breast cancer in your physical exam, labs, mammogram, or symptoms today. You have completed full treatment with your letrozole (which was stopped in September 2024). Your next mammogram will be due in November 2025. Your next office visit will be in December 2025 (annual office visits).    OSTEOPENIA Your bones are slightly weaker and more likely to break, which is related to your breast cancer treatment and being postmenopausal. Continue taking calcium and vitamin D supplements daily. We will check bone density scan in May 2025.  NEUROPATHIC PAIN The pain you have in your right breast, right armpit, and right arm are most consistent with "neuropathic pain."  This is related to nerve damage from your surgery, and can also be related to underlying scar tissue that formed after your surgery. We will start you on DULOXETINE (Cymbalta). Take 30 mg daily for the first week. If you are not having any significant side effects from the low-dose duloxetine, you can increase to 60 mg daily in the second week. Please see the ATTACHED HANDOUT for important information on duloxetine and its potential side effects. We will schedule you for phone visit in 2 months to see if this medication is helping.  ** Thank you for trusting me with your healthcare!  I strive to provide all of my patients with quality care at each visit.  If you receive a survey for this visit, I would be so grateful to you for taking the time to provide feedback.  Thank you in advance!  ~ Denaja Verhoeven                   Dr. Doreatha Massed   &   Rojelio Brenner, PA-C   - - - - - - - - - - - - - - - - - -    Thank you for choosing [facility] at [facility] to provide your  oncology and hematology care.  To afford each patient quality time with our provider, please arrive at least 15 minutes before your scheduled appointment time.   If you have a lab appointment with the Cancer Center please come in thru the Main Entrance and check in at the main information desk.  You need to re-schedule your appointment should you arrive 10 or more minutes late.  We strive to give you quality time with our providers, and arriving late affects you and other patients whose appointments are after yours.  Also, if you no show three or more times for appointments you may be dismissed from the clinic at the providers discretion.     Again, thank you for choosing [facility].  Our hope is that these requests will decrease the amount of time that you wait before being seen by our physicians.       _____________________________________________________________  Should you have questions after your visit to [facility], please contact our office at [phone] and follow the prompts.  Our office hours are 8:00 a.m. and 4:30 p.m. Monday - Friday.  Please note that voicemails left after 4:00 p.m. may not be returned until the following business day.  We are closed weekends and major holidays.  You do have access to a nurse 24-7,  just call the main number to the clinic [phone] and do not press any options, hold on the line and a nurse will answer the phone.    For prescription refill requests, have your pharmacy contact our office and allow 72 hours.

## 2023-11-19 DIAGNOSIS — M5416 Radiculopathy, lumbar region: Secondary | ICD-10-CM | POA: Diagnosis not present

## 2023-11-26 DIAGNOSIS — M170 Bilateral primary osteoarthritis of knee: Secondary | ICD-10-CM | POA: Diagnosis not present

## 2023-11-26 DIAGNOSIS — G8929 Other chronic pain: Secondary | ICD-10-CM | POA: Diagnosis not present

## 2024-01-14 NOTE — Progress Notes (Signed)
VIRTUAL VISIT via TELEPHONE NOTE [facility]   I connected with Alexis Webb  on 01/15/24 at  9:15 AM by telephone and verified that I am speaking with the correct person using two identifiers.  Location: Patient: Home Provider: [facility]   I discussed the limitations, risks, security and privacy concerns of performing an evaluation and management service by telephone and the availability of in person appointments. I also discussed with the patient that there may be a patient responsible charge related to this service. The patient expressed understanding and agreed to proceed.  REASON FOR VISIT: Follow-up of neuropathic breast pain **Also follows at [facility] for right breast DCIS and left breast invasive carcinoma, which were addressed in full at visit on 11/14/2023.  CURRENT THERAPY: Duloxetine 60 mg daily  INTERVAL HISTORY:  Alexis Webb is contacted today for follow-up of her neuropathic breast pain.  She was last seen for follow-up of her breast cancer at office visit with Rojelio Brenner PA-C on 11/14/2023.  At that time, patient noted burning and tingling pain in her right axilla, radiating down her right breast and right arm.  She was started on duloxetine (30 mg daily x 1 week, then increased to therapeutic dose of 60 mg daily) for treatment of neuropathic breast pain.  At today's visit, she reports that her neuropathic pain at site of right lumpectomy has completely resolved after starting duloxetine.  She is tolerating her duloxetine very well, and denies noticing any symptoms/side effects after starting medication.  She has not had any mood changes, mental status changes, depression, or thoughts of self-harm after starting duloxetine.  She denies any rashes/skin changes, abnormal headache, dizziness, excessive sweating, dry mouth, difficulty urinating, constipation, diarrhea, nausea, vomiting, or loss of appetite.   She has not noticed  any breast lumps or changes since her last visit.    ASSESSMENT & PLAN:  1.  Neuropathic breast pain s/p lumpectomy - Ongoing tenderness along the lateral aspect of her right breast which has been present since lumpectomy.  For the past several months she has also noticed some burning and tingling pain in her right axilla, radiating down her right breast and right upper arm.  She denies any injury, infection, decreased range of motion, mass, or lymphadenopathy. - Started on duloxetine (current dose 60 mg daily) in December 2024  - Tolerating duloxetine very well, with complete resolution of neuropathic pain at right breast lumpectomy site - PLAN: Refill Rx for duloxetine (Cymbalta) 60 mg daily sent to pharmacy  2.  OTHER ISSUES not addressed during today's visit - Right breast DCIS (2019) + left breast invasive carcinoma (2002): Screening mammogram of RIGHT breast due 10/16/2024.  Labs (CBC/D, CMP, vitamin D, ferritin, iron/TIBC) and office visit due in December 2025. - Osteopenia: Continue vitamin D 4000 units daily.  DEXA scan due in May 2025, will call with results and any changes to plan.  PLAN SUMMARY: ** All follow-up appointments have already been scheduled. **  >> Bone density scan as scheduled 04/23/2024 -- will watch for results and CALL patient to discuss - if results warrant to treatment, will schedule office visit to discuss >> Mammogram as scheduled 10/17/2024 >> Labs as scheduled on 10/17/2024 = CBC/D, CMP, vitamin D, ferritin, iron/TIBC >> OFFICE visit as scheduled on 10/27/2024     REVIEW OF SYSTEMS:   Review of Systems  Constitutional:  Negative for chills, diaphoresis, fever, malaise/fatigue and weight loss.  Respiratory:  Negative  for cough and shortness of breath.   Cardiovascular:  Negative for chest pain and palpitations.  Gastrointestinal:  Negative for abdominal pain, blood in stool, melena, nausea and vomiting.  Musculoskeletal:  Positive for joint pain.   Neurological:  Negative for dizziness and headaches.  Psychiatric/Behavioral:  The patient has insomnia.      PHYSICAL EXAM: (per limitations of virtual telephone visit)  The patient is alert and oriented x 3, exhibiting adequate mentation, good mood, and ability to speak in full sentences and execute sound judgement.  WRAP UP:   I discussed the assessment and treatment plan with the patient. The patient was provided an opportunity to ask questions and all were answered. The patient agreed with the plan and demonstrated an understanding of the instructions.   The patient was advised to call back or seek an in-person evaluation if the symptoms worsen or if the condition fails to improve as anticipated.  I provided 14 minutes of non-face-to-face time during this encounter, with >10 minutes of medical discussion.  Carnella Guadalajara, PA-C 01/15/24 9:28 AM

## 2024-01-15 ENCOUNTER — Inpatient Hospital Stay: Payer: Medicare (Managed Care) | Attending: Physician Assistant | Admitting: Physician Assistant

## 2024-01-15 DIAGNOSIS — M8580 Other specified disorders of bone density and structure, unspecified site: Secondary | ICD-10-CM | POA: Diagnosis not present

## 2024-01-15 DIAGNOSIS — Z853 Personal history of malignant neoplasm of breast: Secondary | ICD-10-CM | POA: Diagnosis not present

## 2024-01-15 DIAGNOSIS — D0511 Intraductal carcinoma in situ of right breast: Secondary | ICD-10-CM

## 2024-01-15 DIAGNOSIS — Z23 Encounter for immunization: Secondary | ICD-10-CM | POA: Diagnosis not present

## 2024-01-15 DIAGNOSIS — G629 Polyneuropathy, unspecified: Secondary | ICD-10-CM

## 2024-01-15 DIAGNOSIS — Z6837 Body mass index (BMI) 37.0-37.9, adult: Secondary | ICD-10-CM | POA: Diagnosis not present

## 2024-01-15 DIAGNOSIS — E785 Hyperlipidemia, unspecified: Secondary | ICD-10-CM | POA: Diagnosis not present

## 2024-01-15 DIAGNOSIS — Z0001 Encounter for general adult medical examination with abnormal findings: Secondary | ICD-10-CM | POA: Diagnosis not present

## 2024-01-15 DIAGNOSIS — E1169 Type 2 diabetes mellitus with other specified complication: Secondary | ICD-10-CM | POA: Diagnosis not present

## 2024-01-15 MED ORDER — DULOXETINE HCL 60 MG PO CPEP
60.0000 mg | ORAL_CAPSULE | Freq: Every day | ORAL | 11 refills | Status: DC
Start: 2024-01-15 — End: 2024-10-27

## 2024-04-14 DIAGNOSIS — E785 Hyperlipidemia, unspecified: Secondary | ICD-10-CM | POA: Diagnosis not present

## 2024-04-14 DIAGNOSIS — R5383 Other fatigue: Secondary | ICD-10-CM | POA: Diagnosis not present

## 2024-04-14 DIAGNOSIS — E119 Type 2 diabetes mellitus without complications: Secondary | ICD-10-CM | POA: Diagnosis not present

## 2024-04-14 DIAGNOSIS — E559 Vitamin D deficiency, unspecified: Secondary | ICD-10-CM | POA: Diagnosis not present

## 2024-04-14 DIAGNOSIS — D559 Anemia due to enzyme disorder, unspecified: Secondary | ICD-10-CM | POA: Diagnosis not present

## 2024-04-14 DIAGNOSIS — R768 Other specified abnormal immunological findings in serum: Secondary | ICD-10-CM | POA: Diagnosis not present

## 2024-04-23 ENCOUNTER — Ambulatory Visit (HOSPITAL_COMMUNITY)
Admission: RE | Admit: 2024-04-23 | Discharge: 2024-04-23 | Disposition: A | Discharge status: Home / Self Care | Payer: Medicare (Managed Care) | Source: Ambulatory Visit | Attending: Physician Assistant | Admitting: Physician Assistant

## 2024-04-23 DIAGNOSIS — M8589 Other specified disorders of bone density and structure, multiple sites: Secondary | ICD-10-CM | POA: Insufficient documentation

## 2024-04-23 DIAGNOSIS — M8580 Other specified disorders of bone density and structure, unspecified site: Secondary | ICD-10-CM | POA: Diagnosis present

## 2024-04-23 DIAGNOSIS — Z13820 Encounter for screening for osteoporosis: Secondary | ICD-10-CM | POA: Insufficient documentation

## 2024-04-23 DIAGNOSIS — T451X5S Adverse effect of antineoplastic and immunosuppressive drugs, sequela: Secondary | ICD-10-CM | POA: Insufficient documentation

## 2024-04-24 ENCOUNTER — Ambulatory Visit: Payer: Self-pay | Admitting: Physician Assistant

## 2024-05-19 DIAGNOSIS — H5213 Myopia, bilateral: Secondary | ICD-10-CM | POA: Diagnosis not present

## 2024-05-19 DIAGNOSIS — H524 Presbyopia: Secondary | ICD-10-CM | POA: Diagnosis not present

## 2024-05-19 DIAGNOSIS — H52223 Regular astigmatism, bilateral: Secondary | ICD-10-CM | POA: Diagnosis not present

## 2024-05-26 DIAGNOSIS — M5416 Radiculopathy, lumbar region: Secondary | ICD-10-CM | POA: Diagnosis not present

## 2024-06-02 DIAGNOSIS — M170 Bilateral primary osteoarthritis of knee: Secondary | ICD-10-CM | POA: Diagnosis not present

## 2024-06-02 DIAGNOSIS — G8929 Other chronic pain: Secondary | ICD-10-CM | POA: Diagnosis not present

## 2024-06-09 DIAGNOSIS — M5416 Radiculopathy, lumbar region: Secondary | ICD-10-CM | POA: Diagnosis not present

## 2024-06-27 DIAGNOSIS — E119 Type 2 diabetes mellitus without complications: Secondary | ICD-10-CM | POA: Diagnosis not present

## 2024-07-28 DIAGNOSIS — H2513 Age-related nuclear cataract, bilateral: Secondary | ICD-10-CM | POA: Diagnosis not present

## 2024-07-28 DIAGNOSIS — H31091 Other chorioretinal scars, right eye: Secondary | ICD-10-CM | POA: Diagnosis not present

## 2024-07-28 DIAGNOSIS — H35461 Secondary vitreoretinal degeneration, right eye: Secondary | ICD-10-CM | POA: Diagnosis not present

## 2024-07-28 DIAGNOSIS — H43813 Vitreous degeneration, bilateral: Secondary | ICD-10-CM | POA: Diagnosis not present

## 2024-09-09 DIAGNOSIS — M5416 Radiculopathy, lumbar region: Secondary | ICD-10-CM | POA: Diagnosis not present

## 2024-09-09 DIAGNOSIS — M51361 Other intervertebral disc degeneration, lumbar region with lower extremity pain only: Secondary | ICD-10-CM | POA: Diagnosis not present

## 2024-09-18 DIAGNOSIS — M5416 Radiculopathy, lumbar region: Secondary | ICD-10-CM | POA: Diagnosis not present

## 2024-10-17 ENCOUNTER — Ambulatory Visit (HOSPITAL_COMMUNITY): Admission: RE | Admit: 2024-10-17 | Payer: Medicare (Managed Care) | Source: Ambulatory Visit

## 2024-10-17 ENCOUNTER — Encounter (HOSPITAL_COMMUNITY): Payer: Self-pay

## 2024-10-17 ENCOUNTER — Ambulatory Visit (HOSPITAL_COMMUNITY)
Admission: RE | Admit: 2024-10-17 | Discharge: 2024-10-17 | Disposition: A | Discharge status: Home / Self Care | Payer: Medicare (Managed Care) | Source: Ambulatory Visit | Attending: Physician Assistant | Admitting: Physician Assistant

## 2024-10-17 ENCOUNTER — Inpatient Hospital Stay: Payer: Medicare (Managed Care) | Attending: Physician Assistant

## 2024-10-17 DIAGNOSIS — Z86000 Personal history of in-situ neoplasm of breast: Secondary | ICD-10-CM | POA: Insufficient documentation

## 2024-10-17 DIAGNOSIS — D649 Anemia, unspecified: Secondary | ICD-10-CM

## 2024-10-17 DIAGNOSIS — Z08 Encounter for follow-up examination after completed treatment for malignant neoplasm: Secondary | ICD-10-CM | POA: Insufficient documentation

## 2024-10-17 DIAGNOSIS — D0511 Intraductal carcinoma in situ of right breast: Secondary | ICD-10-CM

## 2024-10-17 DIAGNOSIS — M8580 Other specified disorders of bone density and structure, unspecified site: Secondary | ICD-10-CM | POA: Diagnosis not present

## 2024-10-17 DIAGNOSIS — Z800 Family history of malignant neoplasm of digestive organs: Secondary | ICD-10-CM | POA: Diagnosis not present

## 2024-10-17 DIAGNOSIS — Z8051 Family history of malignant neoplasm of kidney: Secondary | ICD-10-CM | POA: Diagnosis not present

## 2024-10-17 DIAGNOSIS — M85852 Other specified disorders of bone density and structure, left thigh: Secondary | ICD-10-CM | POA: Insufficient documentation

## 2024-10-17 DIAGNOSIS — Z923 Personal history of irradiation: Secondary | ICD-10-CM | POA: Diagnosis not present

## 2024-10-17 DIAGNOSIS — Z1231 Encounter for screening mammogram for malignant neoplasm of breast: Secondary | ICD-10-CM | POA: Diagnosis not present

## 2024-10-17 DIAGNOSIS — Z853 Personal history of malignant neoplasm of breast: Secondary | ICD-10-CM | POA: Insufficient documentation

## 2024-10-17 DIAGNOSIS — R928 Other abnormal and inconclusive findings on diagnostic imaging of breast: Secondary | ICD-10-CM | POA: Diagnosis not present

## 2024-10-17 DIAGNOSIS — Z79899 Other long term (current) drug therapy: Secondary | ICD-10-CM | POA: Insufficient documentation

## 2024-10-17 DIAGNOSIS — Z803 Family history of malignant neoplasm of breast: Secondary | ICD-10-CM | POA: Diagnosis not present

## 2024-10-17 DIAGNOSIS — N644 Mastodynia: Secondary | ICD-10-CM | POA: Diagnosis not present

## 2024-10-17 DIAGNOSIS — G629 Polyneuropathy, unspecified: Secondary | ICD-10-CM | POA: Diagnosis not present

## 2024-10-17 LAB — CBC WITH DIFFERENTIAL/PLATELET
Abs Immature Granulocytes: 0.02 K/uL (ref 0.00–0.07)
Basophils Absolute: 0 K/uL (ref 0.0–0.1)
Basophils Relative: 0 %
Eosinophils Absolute: 0.1 K/uL (ref 0.0–0.5)
Eosinophils Relative: 1 %
HCT: 38.1 % (ref 36.0–46.0)
Hemoglobin: 12 g/dL (ref 12.0–15.0)
Immature Granulocytes: 0 %
Lymphocytes Relative: 21 %
Lymphs Abs: 1.8 K/uL (ref 0.7–4.0)
MCH: 29.5 pg (ref 26.0–34.0)
MCHC: 31.5 g/dL (ref 30.0–36.0)
MCV: 93.6 fL (ref 80.0–100.0)
Monocytes Absolute: 0.5 K/uL (ref 0.1–1.0)
Monocytes Relative: 5 %
Neutro Abs: 6.2 K/uL (ref 1.7–7.7)
Neutrophils Relative %: 73 %
Platelets: 197 K/uL (ref 150–400)
RBC: 4.07 MIL/uL (ref 3.87–5.11)
RDW: 14.2 % (ref 11.5–15.5)
WBC: 8.5 K/uL (ref 4.0–10.5)
nRBC: 0 % (ref 0.0–0.2)

## 2024-10-17 LAB — COMPREHENSIVE METABOLIC PANEL WITH GFR
ALT: 9 U/L (ref 0–44)
AST: 20 U/L (ref 15–41)
Albumin: 4.2 g/dL (ref 3.5–5.0)
Alkaline Phosphatase: 119 U/L (ref 38–126)
Anion gap: 10 (ref 5–15)
BUN: 13 mg/dL (ref 6–20)
CO2: 28 mmol/L (ref 22–32)
Calcium: 9.4 mg/dL (ref 8.9–10.3)
Chloride: 103 mmol/L (ref 98–111)
Creatinine, Ser: 0.67 mg/dL (ref 0.44–1.00)
GFR, Estimated: >60 mL/min (ref >60)
Glucose, Bld: 112 mg/dL — ABNORMAL HIGH (ref 70–99)
Potassium: 4.1 mmol/L (ref 3.5–5.1)
Sodium: 141 mmol/L (ref 135–145)
Total Bilirubin: 0.5 mg/dL (ref 0.0–1.2)
Total Protein: 7.4 g/dL (ref 6.5–8.1)

## 2024-10-17 LAB — IRON AND TIBC
Iron: 77 ug/dL (ref 28–170)
Saturation Ratios: 21 % (ref 10.4–31.8)
TIBC: 360 ug/dL (ref 250–450)
UIBC: 283 ug/dL

## 2024-10-17 LAB — FERRITIN: Ferritin: 116 ng/mL (ref 11–307)

## 2024-10-17 LAB — VITAMIN D 25 HYDROXY (VIT D DEFICIENCY, FRACTURES): Vit D, 25-Hydroxy: 34.93 ng/mL (ref 30–100)

## 2024-10-21 ENCOUNTER — Other Ambulatory Visit (HOSPITAL_COMMUNITY): Payer: Self-pay | Admitting: Physician Assistant

## 2024-10-21 DIAGNOSIS — R928 Other abnormal and inconclusive findings on diagnostic imaging of breast: Secondary | ICD-10-CM

## 2024-10-25 NOTE — Progress Notes (Unsigned)
 Alexis Webb 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)   PRIOR THERAPY: - Left breast invasive carcinoma (2002): Lumpectomy, hysterectomy, AC followed by T chemotherapy followed by radiation therapy - Right breast DCIS (2019): XRT to right breast + Femara  2.5 mg daily from 07/09/2018 through 09/03/2023   CURRENT THERAPY: Surveillance  BRIEF ONCOLOGIC HISTORY:   Oncology History  DCIS (ductal carcinoma in situ)  05/14/2018 Cancer Staging   Staging form: Breast, AJCC 8th Edition - Clinical stage from 05/14/2018: Stage 0 (cTis (DCIS), cN0, cM0, ER+, PR+, HER2-) - Signed by Rogers Hai, MD on 09/12/2021 Nuclear grade: G1   09/12/2021 Initial Diagnosis   DCIS (ductal carcinoma in situ)     CANCER STAGING:  Cancer Staging  DCIS (ductal carcinoma in situ) Staging form: Breast, AJCC 8th Edition - Clinical stage from 05/14/2018: Stage 0 (cTis (DCIS), cN0, cM0, ER+, PR+, HER2-) - Signed by Rogers Hai, MD on 09/12/2021   INTERVAL HISTORY:   Ms. Alexis Webb, a 58 y.o. female, returns for routine follow-up of her right breast DCIS (2019) and remote history of left breast invasive carcinoma (2002). Alexis Webb was last seen on 11/14/2023 by Pleasant Barefoot PA-C, and via telephone visit on 01/15/2024.  She denies any recent surgeries, hospitalizations, or changes in baseline health status.  *** At today's visit, she  reports feeling ***.  She reports ***% energy and ***% appetite.   ***She is maintaining stable weight at this time.  ***No new onset breast lumps or axillary lymphadenopathy. ***Denies any new aches or pains, headaches, or abdominal pain.  She continues to take duloxetine  for neuropathic breast pain (burning and tingling pain in  right axilla, radiating down right breast and right upper arm), which is currently well-controlled.  *** ***She does still have some intermittent tenderness on the lateral aspect of right breast, which has been present since lumpectomy.  ***She takes vitamin D  4000 units daily.  ASSESSMENT & PLAN:  1.  Right breast DCIS (2019) - Right breast lumpectomy and SLNB on 05/14/2018 (initially evaluated by Dr. Bernie at [facility]) - Pathology shows DCIS.  No evidence of invasive carcinoma.  Margins negative.  0/3 lymph nodes involved.  ER 100%/PR 100%.  HER2 by FISH negative.  - XRT to right breast from 07/15/2018 through 08/06/2018 - Completed 5-year course of letrozole  2.5 mg daily from August 2019 through September 2024. - Unilateral right breast screening mammogram (10/17/2024) was BI-RADS Category 0, further evaluation needed for possible asymmetry in right breast.  *** - Physical exam today (***)  shows right lumpectomy scar around the areola within normal limits.  There is tenderness in the medial part of the breast, with no clearly palpable mass.  Left breast exam within normal limits s/p mastectomy and breast implant. - Patient history/ROS today negative for any red flag symptoms of breast cancer recurrence*** - Most recent labs (10/17/2024) shows normal LFTs and kidney function.  CBC normal. - PLAN: Proceed with diagnostic mammogram as scheduled for 11/11/2024, further plan TBD based on those results.  *** (If normal, she will resume annual screening mammogram next due November 2026). - RTC 1 year for labs (CBC/D, CMP, vitamin D , ferritin, iron/TIBC) and physical exam.  *** If stable at that time, we will discharge  to PCP.***Tentative discharge TBD?******  2.  Left breast invasive carcinoma (2002) - Reportedly diagnosed in 2002.  I do not have records to review. - She underwent a lumpectomy followed by mastectomy, followed by implant.  She received AC followed by T chemotherapy.  This was  followed by radiation therapy.  No adjuvant antiestrogen therapy was given. - Recently underwent removal and replacement of left breast implant on 11/10/2022.    3.  Neuropathic breast pain s/p lumpectomy - Ongoing tenderness along the lateral aspect of her right breast which has been present since lumpectomy.  She noted some burning and tingling pain in her right axilla, radiating down her right breast and right upper arm.  No associated injury, infection, decreased range of motion, mass, or lymphadenopathy. - Started on duloxetine  (current dose 60 mg daily) in December 2024  - Tolerating duloxetine  very well, with complete resolution of neuropathic pain at right breast lumpectomy site*** - PLAN: Refill Rx for duloxetine  (Cymbalta ) 60 mg daily sent to pharmacy***   4.  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 - DEXA/bone density (04/13/2024): T-score -1.9 (left femoral neck), osteopenia *** - She is taking vitamin D  4000 units daily.***  Unable to take calcium due to constipation. - Most recent vitamin D  (10/17/2024): Normal at 34.93 - PLAN: Continue vitamin D  supplementation 4000 units daily - Defer ongoing monitoring and management of osteopenia to PCP, now that she has completed aromatase inhibitor treatment.  ***  5.  High risk family and personal history - Genetic testing was done on 04/23/2018 at [facility].  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    6.  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  She is non-smoker. - Father had kidney cancer.  Paternal uncle had stomach cancer.  Paternal aunt had breast cancer in her 7s.  Another paternal uncle had prostate cancer.  Another paternal uncle had pancreatic cancer.  Paternal grandmother also had pancreatic cancer. Paternal first cousin had breast cancer.   PLAN SUMMARY: *** TBD pending  diagnostic mammogram in December?  *** >> Refill Rx for duloxetine  sent to pharmacy*** >> Screening mammogram due 10/18/2025 (pending results of diagnostic mammogram in December 2025) *** >> Labs in 1 year = CBC/D, CMP, vitamin D   >> OFFICE visit in 1 year (1 week after labs/mammogram)    REVIEW OF SYSTEMS: ***  Review of Systems  Constitutional:  Negative for appetite change, chills, diaphoresis, fatigue, fever and unexpected weight change.  HENT:   Negative for lump/mass and nosebleeds.   Eyes:  Negative for eye problems.  Respiratory:  Negative for cough, hemoptysis and shortness of breath.   Cardiovascular:  Negative for chest pain, leg swelling and palpitations.  Gastrointestinal:  Negative for abdominal pain, blood in stool, constipation, diarrhea, nausea and vomiting.  Genitourinary:  Negative for hematuria.   Musculoskeletal:  Positive for arthralgias.  Skin: Negative.   Neurological:  Negative for dizziness, headaches and light-headedness.  Hematological:  Does not bruise/bleed easily.   PHYSICAL EXAM: ***  Performance status (ECOG): 0 - Asymptomatic  There were no vitals filed for this visit.  Wt Readings from Last 3 Encounters:  11/14/23 233 lb 9.6 oz (106 kg)  06/26/23 225 lb (102.1 kg)  05/31/23 227 lb (103 kg)   Physical Exam Constitutional:      Appearance: Normal appearance. She is  obese.  Cardiovascular:     Heart sounds: Normal heart sounds.  Pulmonary:     Breath sounds: Normal breath sounds.  Chest:       Comments: Right lumpectomy scar around the areola within normal limits.  Left breast exam within normal limits s/p mastectomy and breast implant.  No discrete nodules, masses, or lymphadenopathy bilaterally. Lymphadenopathy:     Upper Body:     Right upper body: No supraclavicular, axillary or pectoral adenopathy.     Left upper body: No supraclavicular, axillary or pectoral adenopathy.  Neurological:     General: No focal deficit present.      Mental Status: Mental status is at baseline.  Psychiatric:        Behavior: Behavior normal. Behavior is cooperative.      PAST MEDICAL/SURGICAL HISTORY:  Past Medical History:  Diagnosis Date   Anemia    years ago per pt   Cancer (HCC) 2002   left breast, 2019 right breast lymph nodes removed   Fatty liver    Personal history of radiation therapy    PONV (postoperative nausea and vomiting)    Past Surgical History:  Procedure Laterality Date   ANTERIOR CERVICAL DECOMP/DISCECTOMY FUSION N/A 10/13/2016   Procedure: C5-6, C6-7 Anterior Cervical Discectomy and Fusion, Allograft, Plate;  Surgeon: Oneil JAYSON Herald, MD;  Location: [facility] OR;  Service: Orthopedics;  Laterality: N/A;   BREAST IMPLANT REMOVAL Left 11/10/2022   Procedure: REMOVAL BREAST IMPLANTS;  Surgeon: Arelia Filippo, MD;  Location: [facility];  Service: Plastics;  Laterality: Left;   BREAST SURGERY     reconstructive breast surgery on left after mastectomy   COLONOSCOPY WITH PROPOFOL  N/A 06/26/2023   Procedure: COLONOSCOPY WITH PROPOFOL ;  Surgeon: Evonnie Dorothyann LABOR, DO;  Location: [facility];  Service: General;  Laterality: N/A;   MASTECTOMY Left 2002   POLYPECTOMY  06/26/2023   Procedure: POLYPECTOMY;  Surgeon: Evonnie Dorothyann LABOR, DO;  Location: [facility];  Service: General;;   TISSUE EXPANDER PLACEMENT Left 11/10/2022   Procedure: TISSUE EXPANDER PLACEMENT;  Surgeon: Arelia Filippo, MD;  Location: [facility];  Service: Plastics;  Laterality: Left;   TUBAL LIGATION      SOCIAL HISTORY:  Social History   Socioeconomic History   Marital status: Divorced    Spouse name: Not on file   Number of children: Not on file   Years of education: Not on file   Highest education level: Not on file  Occupational History   Not on file  Tobacco Use   Smoking status: Never   Smokeless tobacco: Never  Vaping Use   Vaping status: Never Used  Substance and Sexual Activity   Alcohol  use: No   Drug use: No   Sexual activity: Not on file  Other Topics Concern   Not on file  Social History Narrative   Not on file   Social Drivers of Health   Financial Resource Strain: Low Risk (05/02/2018)   Received from [facility]   Overall Financial Resource Strain (CARDIA)    Difficulty of Paying Living Expenses: Not hard at all  Food Insecurity: No Food Insecurity (01/12/2021)   Received from [facility]   Hunger Vital Sign    Within the past 12 months, you worried that your food would run out before you got the money to buy more.: Never true    Within the past 12 months, the food you bought just didn't last and you didn't have  money to get more.: Never true  Transportation Needs: No Transportation Needs (05/02/2018)   Received from [facility] - Transportation    Lack of Transportation (Medical): No    Lack of Transportation (Non-Medical): No  Physical Activity: Inactive (05/02/2018)   Received from [facility]   Exercise Vital Sign    Days of Exercise per Week: 0 days    Minutes of Exercise per Session: 0 min  Stress: No Stress Concern Present (05/02/2018)   Received from [facility] - Occupational Stress Questionnaire    Feeling of Stress : Not at all  Social Connections: Unknown (04/17/2022)   Received from [facility]   Social Network    Social Network: Not on file  Intimate Partner Violence: Unknown (03/07/2022)   Received from [facility]   HITS    Physically Hurt: Not on file    Insult or Talk Down To: Not on file    Threaten Physical Harm: Not on file    Scream or Curse: Not on file    FAMILY HISTORY:  Family History  Problem Relation Age of Onset   Hypertension Mother    Diabetes Mother    Hypertension Father     CURRENT MEDICATIONS:  Current Outpatient Medications  Medication Sig Dispense Refill   acetaminophen  (TYLENOL ) 500 MG tablet Take 500 mg by mouth every 6 (six) hours  as needed.     Cholecalciferol 50 MCG (2000 UT) TABS Take 4,000 Units by mouth every evening.     DULoxetine  (CYMBALTA ) 60 MG capsule Take 1 capsule (60 mg total) by mouth daily. 30 capsule 11   meloxicam  (MOBIC ) 7.5 MG tablet Take 1-2 tablets (7.5-15 mg total) by mouth daily. (Patient taking differently: Take 7.5 mg by mouth daily.) 30 tablet 0   Sodium Sulfate-Mag Sulfate-KCl (SUTAB ) 1479-225-188 MG TABS Take as directed on the form received at your office visit 24 tablet 0   No current facility-administered medications for this visit.    ALLERGIES:  No Known Allergies  LABORATORY DATA:  I have reviewed the labs as listed.     Latest Ref Rng & Units 10/17/2024   10:03 AM 11/07/2023   10:48 AM 05/11/2023    8:34 AM  CBC  WBC 4.0 - 10.5 K/uL 8.5  9.3  7.4   Hemoglobin 12.0 - 15.0 g/dL 87.9  88.4  88.4   Hematocrit 36.0 - 46.0 % 38.1  37.1  37.4   Platelets 150 - 400 K/uL 197  243  234       Latest Ref Rng & Units 10/17/2024   10:03 AM 11/07/2023   10:48 AM 05/11/2023    8:34 AM  CMP  Glucose 70 - 99 mg/dL 887  882  856   BUN 6 - 20 mg/dL 13  15  13    Creatinine 0.44 - 1.00 mg/dL 9.32  9.20  9.28   Sodium 135 - 145 mmol/L 141  143  141   Potassium 3.5 - 5.1 mmol/L 4.1  3.4  4.2   Chloride 98 - 111 mmol/L 103  106  105   CO2 22 - 32 mmol/L 28  28  26    Calcium 8.9 - 10.3 mg/dL 9.4  9.1  8.9   Total Protein 6.5 - 8.1 g/dL 7.4  7.5  7.4   Total Bilirubin 0.0 - 1.2 mg/dL 0.5  0.6  0.8   Alkaline Phos 38 - 126 U/L 119  123  118   AST 15 - 41 U/L 20  19  19    ALT 0 - 44 U/L 9  10  12      DIAGNOSTIC IMAGING:  I have independently reviewed the scans and discussed with the patient. MM 3D SCREENING MAMMOGRAM UNILATERAL RIGHT BREAST Result Date: 10/21/2024 CLINICAL DATA:  Screening. EXAM: DIGITAL SCREENING UNILATERAL RIGHT MAMMOGRAM WITH CAD AND TOMOSYNTHESIS TECHNIQUE: Right screening digital craniocaudal and mediolateral oblique mammograms were obtained. Right screening digital  breast tomosynthesis was performed. The images were evaluated with computer-aided detection. COMPARISON:  Previous exam(s). ACR Breast Density Category b: There are scattered areas of fibroglandular density. FINDINGS: In the right breast, a possible asymmetry warrants further evaluation. IMPRESSION: Further evaluation is suggested for possible asymmetry in the right breast. RECOMMENDATION: Diagnostic mammogram and possibly ultrasound of the right breast. (Code:[id number]) The patient will be contacted regarding the findings, and additional imaging will be scheduled. BI-RADS CATEGORY  0: Incomplete: Need additional imaging evaluation. Electronically Signed   By: Alm Parkins M.D.   On: 10/21/2024 08:23     WRAP UP:  All questions were answered. The patient knows to call the clinic with any problems, questions or concerns.  Medical decision making: Moderate***  Time spent on visit: I spent *** minutes counseling the patient face to face. The total time spent in the appointment was *** minutes and more than 50% was on counseling.  [name], PA-C  ***

## 2024-10-27 ENCOUNTER — Inpatient Hospital Stay: Payer: Medicare (Managed Care) | Admitting: Physician Assistant

## 2024-10-27 VITALS — BP 142/92 | HR 88 | Temp 97.6°F | Resp 18 | Wt 213.0 lb

## 2024-10-27 DIAGNOSIS — G629 Polyneuropathy, unspecified: Secondary | ICD-10-CM | POA: Diagnosis not present

## 2024-10-27 DIAGNOSIS — D0511 Intraductal carcinoma in situ of right breast: Secondary | ICD-10-CM | POA: Diagnosis not present

## 2024-10-27 DIAGNOSIS — D649 Anemia, unspecified: Secondary | ICD-10-CM

## 2024-10-27 MED ORDER — DULOXETINE HCL 60 MG PO CPEP: 60.0000 mg | ORAL_CAPSULE | Freq: Every day | ORAL | 11 refills | Status: AC

## 2024-10-27 NOTE — Patient Instructions (Signed)
 [facility] at [facility] **VISIT SUMMARY & IMPORTANT INSTRUCTIONS **   You were seen today by Alexis Barefoot PA-C for your history of breast cancer.    HISTORY OF BREAST CANCER There were no signs of recurrent breast cancer in your physical exam or labs today. Your mammogram showed a possible abnormality thought, so you are scheduled for diagnostic mammogram and ultrasound on 11/11/2024. As long as your diagnostic mammogram is normal, we will plan on another mammogram in November 2026, followed by annual office visit.  At that time, we will discuss possible discharge to her primary care provider.    OSTEOPENIA Your bones are slightly weaker and more likely to break, which is related to your breast cancer treatment and being postmenopausal. Continue taking calcium and vitamin D  supplements daily.  NEUROPATHIC PAIN Your duloxetine  (Cymbalta ) 60 mg daily was helping to control the nerve pain in the right breast.  I suspect that your nausea may be related to your high intake of zero-calorie drinks and diet sodas, which contain chemicals that can irritate your stomach. I recommend cutting back on diet soda and restarting duloxetine  for better control of your symptoms.   ** Thank you for trusting me with your healthcare!  I strive to provide all of my patients with quality care at each visit.  If you receive a survey for this visit, I would be so grateful to you for taking the time to provide feedback.  Thank you in advance!  ~ Alexis Webb                                        Dr. Mickiel Webb Alexis Barefoot, PA-C      Alexis Hope, NP   - - - - - - - - - - - - - - - - - -     Thank you for choosing [facility] at [facility] to provide your oncology and hematology care.  To afford each patient quality time with our provider, please arrive at least 15 minutes before your scheduled appointment time.   If you have a lab appointment with  the Cancer Center please come in thru the Main Entrance and check in at the main information desk.  You need to re-schedule your appointment should you arrive 10 or more minutes late.  We strive to give you quality time with our providers, and arriving late affects you and other patients whose appointments are after yours.  Also, if you no show three or more times for appointments you may be dismissed from the clinic at the providers discretion.     Again, thank you for choosing [facility].  Our Webb is that these requests will decrease the amount of time that you wait before being seen by our physicians.       _____________________________________________________________  Should you have questions after your visit to [facility], please contact our office at [phone] and follow the prompts.  Our office hours are 8:00 a.m. and 4:30 p.m. Monday - Friday.  Please note that voicemails left after 4:00 p.m. may not be returned until the following business day.  We are closed weekends and major holidays.  You do have access to a nurse 24-7, just call the main number to the clinic [phone] and do not press any options, hold  on the line and a nurse will answer the phone.    For prescription refill requests, have your pharmacy contact our office and allow 72 hours.

## 2024-11-11 ENCOUNTER — Encounter (HOSPITAL_COMMUNITY): Payer: Self-pay

## 2024-11-11 ENCOUNTER — Other Ambulatory Visit (HOSPITAL_COMMUNITY): Payer: Self-pay | Admitting: Physician Assistant

## 2024-11-11 ENCOUNTER — Ambulatory Visit (HOSPITAL_COMMUNITY)
Admission: RE | Admit: 2024-11-11 | Discharge: 2024-11-11 | Disposition: A | Payer: Medicare (Managed Care) | Source: Ambulatory Visit | Attending: Physician Assistant

## 2024-11-11 DIAGNOSIS — R928 Other abnormal and inconclusive findings on diagnostic imaging of breast: Secondary | ICD-10-CM | POA: Diagnosis not present

## 2024-11-11 DIAGNOSIS — Z9012 Acquired absence of left breast and nipple: Secondary | ICD-10-CM | POA: Diagnosis not present

## 2024-11-11 DIAGNOSIS — N6314 Unspecified lump in the right breast, lower inner quadrant: Secondary | ICD-10-CM | POA: Diagnosis not present

## 2024-11-24 ENCOUNTER — Other Ambulatory Visit (HOSPITAL_COMMUNITY): Payer: Self-pay | Admitting: Physician Assistant

## 2024-11-24 DIAGNOSIS — R928 Other abnormal and inconclusive findings on diagnostic imaging of breast: Secondary | ICD-10-CM

## 2024-11-25 ENCOUNTER — Encounter (HOSPITAL_COMMUNITY): Payer: Self-pay

## 2024-11-25 ENCOUNTER — Ambulatory Visit (HOSPITAL_COMMUNITY)
Admission: RE | Admit: 2024-11-25 | Discharge: 2024-11-25 | Disposition: A | Discharge status: Home / Self Care | Payer: Medicare (Managed Care) | Source: Ambulatory Visit | Attending: Physician Assistant | Admitting: Physician Assistant

## 2024-11-25 DIAGNOSIS — R928 Other abnormal and inconclusive findings on diagnostic imaging of breast: Secondary | ICD-10-CM | POA: Diagnosis not present

## 2024-11-25 DIAGNOSIS — N6314 Unspecified lump in the right breast, lower inner quadrant: Secondary | ICD-10-CM | POA: Insufficient documentation

## 2024-11-25 HISTORY — PX: BREAST BIOPSY: SHX20

## 2024-11-25 MED ORDER — LIDOCAINE-EPINEPHRINE (PF) 1 %-1:200000 IJ SOLN
10.0000 mL | Freq: Once | INTRAMUSCULAR | Status: AC
  Administered 2024-11-25: 10 mL via INTRADERMAL

## 2024-11-25 MED ORDER — LIDOCAINE HCL (PF) 2 % IJ SOLN
INTRAMUSCULAR | Status: AC
  Filled 2024-11-25: qty 20

## 2024-11-25 MED ORDER — LIDOCAINE HCL (PF) 2 % IJ SOLN
10.0000 mL | Freq: Once | INTRAMUSCULAR | Status: AC
  Administered 2024-11-25: 10 mL

## 2024-11-25 NOTE — Progress Notes (Signed)
 PT tolerated right breast biopsy well today with NAD noted. PT verbalized understanding of discharge instructions and given ice packs to use. PT ambulated back to the mammogram area this time. Specimens taken to lab by Colette from ultrasound for processing.

## 2024-11-26 ENCOUNTER — Ambulatory Visit: Payer: Self-pay | Admitting: Physician Assistant

## 2024-11-26 DIAGNOSIS — C50911 Malignant neoplasm of unspecified site of right female breast: Secondary | ICD-10-CM

## 2024-11-26 NOTE — Progress Notes (Addendum)
 Patient had biopsy of right breast mass performed on 11/25/2024. Patient with 1.4 cm right breast mass, history of bilateral breast cancer s/p previous right lumpectomy and left mastectomy. Pathology: Invasive ductal carcinoma of right breast.  Hormone status not yet available.  Pathology results reviewed with Dr. Davonna, who recommends: - Surgical referral for lumpectomy versus mastectomy - CT CAP (recommends additional imaging to check for metastases, as this is patient's third episode of breast cancer) - MD visit with medical oncologist (Dr. Davonna) within the next 4 weeks  I called and discussed with patient Alexis Webb via telephone at 11:00 AM on 11/26/2024. We discussed the above recommendations, and she verbalized understanding and agreement with the above. She is agreeable to surgical referral to local surgeon in [place].  [name], PA-C 11/26/2024 11:11 AM

## 2024-11-29 DIAGNOSIS — R059 Cough, unspecified: Secondary | ICD-10-CM | POA: Diagnosis not present

## 2024-11-29 DIAGNOSIS — J101 Influenza due to other identified influenza virus with other respiratory manifestations: Secondary | ICD-10-CM | POA: Diagnosis not present

## 2024-12-01 ENCOUNTER — Encounter: Payer: Self-pay | Admitting: *Deleted

## 2024-12-05 LAB — SURGICAL PATHOLOGY

## 2024-12-09 ENCOUNTER — Ambulatory Visit (HOSPITAL_BASED_OUTPATIENT_CLINIC_OR_DEPARTMENT_OTHER)
Admission: RE | Admit: 2024-12-09 | Discharge: 2024-12-09 | Disposition: A | Discharge status: Home / Self Care | Payer: Medicare (Managed Care) | Source: Ambulatory Visit | Attending: Physician Assistant | Admitting: Physician Assistant

## 2024-12-09 DIAGNOSIS — C50911 Malignant neoplasm of unspecified site of right female breast: Secondary | ICD-10-CM | POA: Insufficient documentation

## 2024-12-09 MED ORDER — IOHEXOL 300 MG/ML  SOLN
100.0000 mL | Freq: Once | INTRAMUSCULAR | Status: AC | PRN
  Administered 2024-12-09: 100 mL via INTRAVENOUS

## 2024-12-09 MED ORDER — IOHEXOL 300 MG/ML  SOLN: 100.0000 mL | Freq: Once | INTRAMUSCULAR | Status: DC | PRN

## 2024-12-10 ENCOUNTER — Encounter: Payer: Self-pay | Admitting: *Deleted

## 2024-12-10 NOTE — Progress Notes (Signed)
 Alexis Webb                                          MRN: [id number]   12/10/2024   The [facility] Quality Team Specialist reviewed this patient medical record for the purposes of chart review for care gap closure. The following were reviewed: chart review for care gap closure-glycemic status assessment.    [facility]I Quality Team

## 2024-12-18 ENCOUNTER — Ambulatory Visit: Payer: Medicare (Managed Care) | Admitting: Surgery

## 2024-12-18 ENCOUNTER — Encounter: Payer: Self-pay | Admitting: Surgery

## 2024-12-18 VITALS — BP 126/84 | HR 86 | Temp 98.2°F | Resp 14 | Ht 65.0 in | Wt 208.0 lb

## 2024-12-18 DIAGNOSIS — C50911 Malignant neoplasm of unspecified site of right female breast: Secondary | ICD-10-CM | POA: Diagnosis not present

## 2024-12-18 DIAGNOSIS — Z171 Estrogen receptor negative status [ER-]: Secondary | ICD-10-CM | POA: Diagnosis not present

## 2024-12-18 NOTE — Progress Notes (Unsigned)
 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 female.  HPI: Patient presents for evaluation of a newly diagnosed right breast cancer.  She has a personal history of left breast cancer that was diagnosed in her early 13s.  At that time, she presented with a lump.  She did undergo genetic testing, which was negative.  She initially underwent a lumpectomy with sentinel lymph node biopsy, however she had to undergo a full mastectomy secondary to positive margins.  She also underwent left chest radiation and chemotherapy.  She also had a left breast implant placed, which subsequently had to be replaced secondary to rupture in 2023.  She states that she had a right breast lumpectomy when she was much younger, but this was for a benign pathology.  She also has a personal history of DCIS that was diagnosed in 2019.  At that time, it was noted secondary to an abnormality on her mammogram.  She subsequently underwent lumpectomy with sentinel lymph node biopsy and right chest radiation.  She denies undergoing any chemotherapy at this time.  Since her DCIS diagnosis, she has had annual mammograms which have been negative.  She does complain of some right breast pain that have been going on for about 2 years.  She discussed this with oncology, who placed her on Cymbalta .  She denies feeling any abnormal masses, lumps, bumps or nipple discharge recently.  She had menarche at age 69, and her first pregnancy at age 71. She is G2P2. She has no family history of breast cancer. She underwent menopause after she underwent chemotherapy in her 30s for her left breast cancer.  Again, she has had bilateral chest radiation for right breast DCIS and left breast cancer.  Her past medical history significant for hyperlipidemia.  She denies use of blood  thinning medications.  Other than her breast surgeries, she has also undergone a neck orthopedic surgery and Port-A-Cath insertion and removal.  She denies use of tobacco products, alcohol, and illicit drugs.  He   Past Medical History:  Diagnosis Date   Anemia    years ago per pt   Cancer (HCC) 2002   left breast, 2019 right breast lymph nodes removed   Fatty liver    Personal history of radiation therapy    PONV (postoperative nausea and vomiting)     Past Surgical History:  Procedure Laterality Date   ANTERIOR CERVICAL DECOMP/DISCECTOMY FUSION N/A 10/13/2016   Procedure: C5-6, C6-7 Anterior Cervical Discectomy and Fusion, Allograft, Plate;  Surgeon: Oneil JAYSON Herald, MD;  Location: [facility] OR;  Service: Orthopedics;  Laterality: N/A;   BREAST BIOPSY Right 11/25/2024   US  RT BREAST BX W LOC DEV 1ST LESION IMG BX SPEC US  GUIDE 11/25/2024 [facility]-ULTRASOUND   BREAST IMPLANT REMOVAL Left 11/10/2022   Procedure: REMOVAL BREAST IMPLANTS;  Surgeon: Arelia Filippo, MD;  Location: [facility];  Service: Plastics;  Laterality: Left;   BREAST LUMPECTOMY Right 2019   BREAST SURGERY     reconstructive breast surgery on left after mastectomy   COLONOSCOPY WITH PROPOFOL  N/A 06/26/2023   Procedure: COLONOSCOPY WITH PROPOFOL ;  Surgeon: Evonnie Dorothyann LABOR, DO;  Location: [facility];  Service: General;  Laterality: N/A;   MASTECTOMY Left 2002   POLYPECTOMY  06/26/2023   Procedure: POLYPECTOMY;  Surgeon: Evonnie Dorothyann LABOR, DO;  Location:  [facility];  Service: General;;   TISSUE EXPANDER PLACEMENT Left 11/10/2022   Procedure: TISSUE EXPANDER PLACEMENT;  Surgeon: Arelia Filippo, MD;  Location: [facility];  Service: Plastics;  Laterality: Left;   TUBAL LIGATION      Family History  Problem Relation Age of Onset   Hypertension Mother    Diabetes Mother    Hypertension Father     Social History[1]  Medications: I have reviewed the patient's current  medications. Allergies as of 12/18/2024   No Known Allergies      Medication List        Accurate as of December 18, 2024 10:02 AM. If you have any questions, ask your nurse or doctor.          STOP taking these medications    Sutab  [id number] MG Tabs Generic drug: Sodium Sulfate-Mag Sulfate-KCl       TAKE these medications    acetaminophen  500 MG tablet Commonly known as: TYLENOL  Take 500 mg by mouth every 6 (six) hours as needed.   Cholecalciferol 50 MCG (2000 UT) Tabs Take 4,000 Units by mouth every evening.   DULoxetine  60 MG capsule Commonly known as: CYMBALTA  Take 1 capsule (60 mg total) by mouth daily.   meloxicam  7.5 MG tablet Commonly known as: Mobic  Take 1-2 tablets (7.5-15 mg total) by mouth daily.   rosuvastatin 10 MG tablet Commonly known as: CRESTOR SMARTSIG:1 Tablet(s) By Mouth Every Evening         ROS:  Constitutional: negative for chills, fatigue, and fevers Eyes: negative for visual disturbance and pain Ears, nose, mouth, throat, and face: positive for sinus problems, negative for ear drainage and sore throat Respiratory: positive for cough, negative for wheezing and shortness of breath Cardiovascular: negative for chest pain and palpitations Gastrointestinal: negative for abdominal pain, nausea, reflux symptoms, and vomiting Genitourinary:negative for dysuria and frequency Integument/breast: negative for dryness and rash Hematologic/lymphatic: negative for bleeding and lymphadenopathy Musculoskeletal:positive for back pain, negative for neck pain Neurological: negative for dizziness and tremors Endocrine: negative for temperature intolerance  There were no vitals taken for this visit. Physical Exam Vitals reviewed.  Constitutional:      Appearance: Normal appearance.  HENT:     Head: Normocephalic and atraumatic.  Eyes:     Extraocular Movements: Extraocular movements intact.     Pupils: Pupils are equal, round, and reactive  to light.  Cardiovascular:     Rate and Rhythm: Normal rate and regular rhythm.  Pulmonary:     Effort: Pulmonary effort is normal.     Breath sounds: Normal breath sounds.  Chest:  Breasts:    Right: No swelling, bleeding, inverted nipple, mass, nipple discharge, skin change or tenderness.     Comments: No palpable right breast abnormalities, no axillary lymphadenopathy noted on either the right or left side; left breast with scarring noted secondary to mastectomy with palpable underlying implant, no significant abnormalities noted on the left chest Abdominal:     General: There is no distension.     Palpations: Abdomen is soft.     Tenderness: There is no abdominal tenderness.  Musculoskeletal:        General: Normal range of motion.     Cervical back: Normal range of motion.  Skin:    General: Skin is warm and dry.  Neurological:     General: No focal deficit present.     Mental Status: She is alert and oriented to person, place, and time.  Psychiatric:  Mood and Affect: Mood normal.        Behavior: Behavior normal.     Results: Bilateral screening Mammogram (10/17/24): IMPRESSION: Further evaluation is suggested for possible asymmetry in the right breast.   RECOMMENDATION: Diagnostic mammogram and possibly ultrasound of the right breast. (Code:[id number]-81M)   The patient will be contacted regarding the findings, and additional imaging will be scheduled.   BI-RADS CATEGORY  0: Incomplete: Need additional imaging evaluation.  Diagnostic Right breast mammogram (11/11/24): IMPRESSION: Suspicious 1.4 cm RIGHT breast mass (4 o'clock 3 CMFN), corresponding to the mammographic mass.   RECOMMENDATION: Ultrasound-guided core needle biopsy of 1.4 cm RIGHT breast mass (4 o'clock 3 CMFN).   I have discussed the findings and recommendations with the patient. The biopsy procedure was explained to the patient and questions were answered. Patient expressed their  understanding of the biopsy recommendation.   Patient will be scheduled for biopsy at her earliest convenience by the schedulers.   Ordering provider will be notified. If applicable, a reminder letter will be sent to the patient regarding the next appointment.   BI-RADS CATEGORY  4: Suspicious.  Right breast ultrasound (11/11/24): IMPRESSION: Suspicious 1.4 cm RIGHT breast mass (4 o'clock 3 CMFN), corresponding to the mammographic mass.   RECOMMENDATION: Ultrasound-guided core needle biopsy of 1.4 cm RIGHT breast mass (4 o'clock 3 CMFN).   I have discussed the findings and recommendations with the patient. The biopsy procedure was explained to the patient and questions were answered. Patient expressed their understanding of the biopsy recommendation.   Patient will be scheduled for biopsy at her earliest convenience by the schedulers.   Ordering provider will be notified. If applicable, a reminder letter will be sent to the patient regarding the next appointment.   BI-RADS CATEGORY  4: Suspicious.  US  guided biopsy of right breast (11/25/24): IMPRESSION: Ultrasound guided biopsy of a right breast mass. No apparent complications.  ADDENDUM: PATHOLOGY revealed: A. BREAST MASS, RIGHT 4:00 O'CLOCK , NEEDLE CORE BIOPSY: Invasive ductal carcinoma, Overall Grade: 3-   Lymphovascular Invasion: Not identified- Cancer Length: 0.8 cm- Calcifications: Not identified   Pathology results are CONCORDANT with imaging findings, per Dr. Alm Parkins.   Pathology results and recommendations below were discussed with patient by telephone. Patient reported biopsy site doing well with no adverse symptoms, and only slight tenderness at the site. Post biopsy care instructions were reviewed, questions were answered and my direct phone number was provided. Patient was instructed to call [facility] for any additional questions or concerns related to biopsy site.    RECOMMENDATION: Patient to follow treatment plan / surgical management set by her cancer care team. Pleasant Barefoot, PA with Oncology discussed recommendations with patient on 11/26/24 and a surgical referral was sent.  Assessment & Plan:  Brynlyn Dade Polo is a 59 y.o. female who presents with a newly diagnosed right breast cancer.  -We have discussed the patient's option for surgery as a mastectomy with sentinel node biopsy.  Given that the patient has previously had a lumpectomy on her right breast and received chest radiation, she is not a candidate for lumpectomy. We have discussed that she will see oncology after our procedure to further discuss her options for chemotherapy and hormonal therapy if she qualifies.  -We have also discussed the need for injection of Magtrace to perform the sentinel node biopsy.  Since she has undergone previous lumpectomy with sentinel lymph node biopsy, we will attempt to repeat sentinel lymph node biopsy, however, she  may require axillary lymph node dissection versus selective lymph node biopsy if her right axilla does now map with Magtrace. -We have discussed that the sentinel node biopsy tells us  if the cancer has spread to the lymph nodes and can help with plans for chemotherapy treatment and overall prognosis.   -We have discussed that a positive sentinel node can require further removal of lymph nodes from the axilla but that recent research does not show any improvement in disease free survival and carries greater risk for lymphedema.   -We have discussed that these are big discussions, and that the risk from the operation are similar including risk of bleeding, risk of infection, and risk of needing additional surgeries.  -We have discussed the need for an overnight stay with a mastectomy and a drain that will remain in place for about 1 week.   -Patient tentatively scheduled for surgery on 1/30 -Information provided regarding mastectomy, breast cancer,  and sentinel lymph node biopsy  All questions were answered to the satisfaction of the patient.  Note: Portions of this report may have been transcribed using voice recognition software. Every effort has been made to ensure accuracy; however, inadvertent computerized transcription errors may still be present.   Dorothyann Brittle, DO [facility] [address] [place], [place] [address] [phone] (office)          [1]  Social History Tobacco Use   Smoking status: Never   Smokeless tobacco: Never  Vaping Use   Vaping status: Never Used  Substance Use Topics   Alcohol use: No   Drug use: No

## 2024-12-19 NOTE — H&P (Signed)
 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 female.  HPI: Patient presents for evaluation of a newly diagnosed right breast cancer.  She has a personal history of left breast cancer that was diagnosed in her early 71s.  At that time, she presented with a lump.  She did undergo genetic testing, which was negative.  She initially underwent a lumpectomy with sentinel lymph node biopsy, however she had to undergo a full mastectomy secondary to positive margins.  She also underwent left chest radiation and chemotherapy.  She also had a left breast implant placed, which subsequently had to be replaced secondary to rupture in 2023.  She states that she had a right breast lumpectomy when she was much younger, but this was for a benign pathology.  She also has a personal history of DCIS that was diagnosed in 2019.  At that time, it was noted secondary to an abnormality on her mammogram.  She subsequently underwent lumpectomy with sentinel lymph node biopsy and right chest radiation.  She denies undergoing any chemotherapy at this time.  Since her DCIS diagnosis, she has had annual mammograms which have been negative.  She does complain of some right breast pain that have been going on for about 2 years.  She discussed this with oncology, who placed her on Cymbalta .  She denies feeling any abnormal masses, lumps, bumps or nipple discharge recently.  She had menarche at age 46, and her first pregnancy at age 48. She is G2P2. She has no family history of breast cancer. She underwent menopause after she underwent chemotherapy in her 30s for her left breast cancer.  Again, she has had bilateral chest radiation for right breast DCIS and left breast cancer.  Her past medical history significant for hyperlipidemia.  She denies use of blood  thinning medications.  Other than her breast surgeries, she has also undergone a neck orthopedic surgery and Port-A-Cath insertion and removal.  She denies use of tobacco products, alcohol, and illicit drugs.  He   Past Medical History:  Diagnosis Date   Anemia    years ago per pt   Cancer (HCC) 2002   left breast, 2019 right breast lymph nodes removed   Fatty liver    Personal history of radiation therapy    PONV (postoperative nausea and vomiting)     Past Surgical History:  Procedure Laterality Date   ANTERIOR CERVICAL DECOMP/DISCECTOMY FUSION N/A 10/13/2016   Procedure: C5-6, C6-7 Anterior Cervical Discectomy and Fusion, Allograft, Plate;  Surgeon: Oneil JAYSON Herald, MD;  Location: [facility] OR;  Service: Orthopedics;  Laterality: N/A;   BREAST BIOPSY Right 11/25/2024   US  RT BREAST BX W LOC DEV 1ST LESION IMG BX SPEC US  GUIDE 11/25/2024 [facility]-ULTRASOUND   BREAST IMPLANT REMOVAL Left 11/10/2022   Procedure: REMOVAL BREAST IMPLANTS;  Surgeon: Arelia Filippo, MD;  Location: [facility];  Service: Plastics;  Laterality: Left;   BREAST LUMPECTOMY Right 2019   BREAST SURGERY     reconstructive breast surgery on left after mastectomy   COLONOSCOPY WITH PROPOFOL  N/A 06/26/2023   Procedure: COLONOSCOPY WITH PROPOFOL ;  Surgeon: Evonnie Dorothyann LABOR, DO;  Location: [facility];  Service: General;  Laterality: N/A;   MASTECTOMY Left 2002   POLYPECTOMY  06/26/2023   Procedure: POLYPECTOMY;  Surgeon: Evonnie Dorothyann LABOR, DO;  Location:  [facility];  Service: General;;   TISSUE EXPANDER PLACEMENT Left 11/10/2022   Procedure: TISSUE EXPANDER PLACEMENT;  Surgeon: Arelia Filippo, MD;  Location: [facility];  Service: Plastics;  Laterality: Left;   TUBAL LIGATION      Family History  Problem Relation Age of Onset   Hypertension Mother    Diabetes Mother    Hypertension Father     [Social History]  [Social History] Tobacco Use   Smoking status: Never    Smokeless tobacco: Never  Vaping Use   Vaping status: Never Used  Substance Use Topics   Alcohol use: No   Drug use: No    Medications: I have reviewed the patient's current medications. Allergies as of 12/18/2024   No Known Allergies      Medication List        Accurate as of December 18, 2024 10:02 AM. If you have any questions, ask your nurse or doctor.          STOP taking these medications    Sutab  [id number] MG Tabs Generic drug: Sodium Sulfate-Mag Sulfate-KCl       TAKE these medications    acetaminophen  500 MG tablet Commonly known as: TYLENOL  Take 500 mg by mouth every 6 (six) hours as needed.   Cholecalciferol 50 MCG (2000 UT) Tabs Take 4,000 Units by mouth every evening.   DULoxetine  60 MG capsule Commonly known as: CYMBALTA  Take 1 capsule (60 mg total) by mouth daily.   meloxicam  7.5 MG tablet Commonly known as: Mobic  Take 1-2 tablets (7.5-15 mg total) by mouth daily.   rosuvastatin 10 MG tablet Commonly known as: CRESTOR SMARTSIG:1 Tablet(s) By Mouth Every Evening         ROS:  Constitutional: negative for chills, fatigue, and fevers Eyes: negative for visual disturbance and pain Ears, nose, mouth, throat, and face: positive for sinus problems, negative for ear drainage and sore throat Respiratory: positive for cough, negative for wheezing and shortness of breath Cardiovascular: negative for chest pain and palpitations Gastrointestinal: negative for abdominal pain, nausea, reflux symptoms, and vomiting Genitourinary:negative for dysuria and frequency Integument/breast: negative for dryness and rash Hematologic/lymphatic: negative for bleeding and lymphadenopathy Musculoskeletal:positive for back pain, negative for neck pain Neurological: negative for dizziness and tremors Endocrine: negative for temperature intolerance  There were no vitals taken for this visit. Physical Exam Vitals reviewed.  Constitutional:      Appearance:  Normal appearance.  HENT:     Head: Normocephalic and atraumatic.  Eyes:     Extraocular Movements: Extraocular movements intact.     Pupils: Pupils are equal, round, and reactive to light.  Cardiovascular:     Rate and Rhythm: Normal rate and regular rhythm.  Pulmonary:     Effort: Pulmonary effort is normal.     Breath sounds: Normal breath sounds.  Chest:  Breasts:    Right: No swelling, bleeding, inverted nipple, mass, nipple discharge, skin change or tenderness.     Comments: No palpable right breast abnormalities, no axillary lymphadenopathy noted on either the right or left side; left breast with scarring noted secondary to mastectomy with palpable underlying implant, no significant abnormalities noted on the left chest Abdominal:     General: There is no distension.     Palpations: Abdomen is soft.     Tenderness: There is no abdominal tenderness.  Musculoskeletal:        General: Normal range of motion.     Cervical back: Normal range of motion.  Skin:    General: Skin is warm and dry.  Neurological:     General: No focal deficit present.     Mental Status: She is alert and oriented to person, place, and time.  Psychiatric:        Mood and Affect: Mood normal.        Behavior: Behavior normal.     Results: Bilateral screening Mammogram (10/17/24): IMPRESSION: Further evaluation is suggested for possible asymmetry in the right breast.   RECOMMENDATION: Diagnostic mammogram and possibly ultrasound of the right breast. (Code:[id number])   The patient will be contacted regarding the findings, and additional imaging will be scheduled.   BI-RADS CATEGORY  0: Incomplete: Need additional imaging evaluation.  Diagnostic Right breast mammogram (11/11/24): IMPRESSION: Suspicious 1.4 cm RIGHT breast mass (4 o'clock 3 CMFN), corresponding to the mammographic mass.   RECOMMENDATION: Ultrasound-guided core needle biopsy of 1.4 cm RIGHT breast mass (4 o'clock 3 CMFN).    I have discussed the findings and recommendations with the patient. The biopsy procedure was explained to the patient and questions were answered. Patient expressed their understanding of the biopsy recommendation.   Patient will be scheduled for biopsy at her earliest convenience by the schedulers.   Ordering provider will be notified. If applicable, a reminder letter will be sent to the patient regarding the next appointment.   BI-RADS CATEGORY  4: Suspicious.  Right breast ultrasound (11/11/24): IMPRESSION: Suspicious 1.4 cm RIGHT breast mass (4 o'clock 3 CMFN), corresponding to the mammographic mass.   RECOMMENDATION: Ultrasound-guided core needle biopsy of 1.4 cm RIGHT breast mass (4 o'clock 3 CMFN).   I have discussed the findings and recommendations with the patient. The biopsy procedure was explained to the patient and questions were answered. Patient expressed their understanding of the biopsy recommendation.   Patient will be scheduled for biopsy at her earliest convenience by the schedulers.   Ordering provider will be notified. If applicable, a reminder letter will be sent to the patient regarding the next appointment.   BI-RADS CATEGORY  4: Suspicious.  US  guided biopsy of right breast (11/25/24): IMPRESSION: Ultrasound guided biopsy of a right breast mass. No apparent complications.  ADDENDUM: PATHOLOGY revealed: A. BREAST MASS, RIGHT 4:00 O'CLOCK , NEEDLE CORE BIOPSY: Invasive ductal carcinoma, Overall Grade: 3-   Lymphovascular Invasion: Not identified- Cancer Length: 0.8 cm- Calcifications: Not identified   Pathology results are CONCORDANT with imaging findings, per Dr. Alm Parkins.   Pathology results and recommendations below were discussed with patient by telephone. Patient reported biopsy site doing well with no adverse symptoms, and only slight tenderness at the site. Post biopsy care instructions were reviewed, questions were answered  and my direct phone number was provided. Patient was instructed to call [facility] for any additional questions or concerns related to biopsy site.   RECOMMENDATION: Patient to follow treatment plan / surgical management set by her cancer care team. Pleasant Barefoot, PA with Oncology discussed recommendations with patient on 11/26/24 and a surgical referral was sent.  Assessment & Plan:  Alexis Webb is a 59 y.o. female who presents with a newly diagnosed right breast cancer.  -We have discussed the patient's option for surgery as a mastectomy with sentinel node biopsy.  Given that the patient has previously had a lumpectomy on her right breast and received chest radiation, she is not a candidate for lumpectomy. We have discussed that she will see oncology after our procedure to further discuss her options for chemotherapy  and hormonal therapy if she qualifies.  -We have also discussed the need for injection of Magtrace to perform the sentinel node biopsy.  Since she has undergone previous lumpectomy with sentinel lymph node biopsy, we will attempt to repeat sentinel lymph node biopsy, however, she may require axillary lymph node dissection versus selective lymph node biopsy if her right axilla does now map with Magtrace. -We have discussed that the sentinel node biopsy tells us  if the cancer has spread to the lymph nodes and can help with plans for chemotherapy treatment and overall prognosis.   -We have discussed that a positive sentinel node can require further removal of lymph nodes from the axilla but that recent research does not show any improvement in disease free survival and carries greater risk for lymphedema.   -We have discussed that these are big discussions, and that the risk from the operation are similar including risk of bleeding, risk of infection, and risk of needing additional surgeries.  -We have discussed the need for an overnight stay with a  mastectomy and a drain that will remain in place for about 1 week.   -Patient tentatively scheduled for surgery on 1/30 -Information provided regarding mastectomy, breast cancer, and sentinel lymph node biopsy  All questions were answered to the satisfaction of the patient.  Note: Portions of this report may have been transcribed using voice recognition software. Every effort has been made to ensure accuracy; however, inadvertent computerized transcription errors may still be present.   Dorothyann Brittle, DO [facility] [address] [place], [place] [address] [phone] (office)

## 2024-12-22 NOTE — Progress Notes (Signed)
 Left breast Patient Care Team: Vida Mardy VEAR DEVONNA as PCP - General (Physician Assistant)  Clinic Day:  12/22/2024  Referring physician: Vida Mardy VEAR, PA-C   CHIEF COMPLAINT:  CC: Right breast invasive ductal carcinoma and Left breast invasive carcinoma  Alexis Webb 59 y.o. female was transferred to my care after her prior physician has left.   ASSESSMENT & PLAN:   Assessment & Plan: Alexis Webb  is a 59 y.o. female with ***  Assessment and Plan     The patient understands the plans discussed today and is in agreement with them.  She knows to contact our office if she develops concerns prior to her next appointment.  *** minutes of total time was spent for this patient encounter, including preparation,review of records,  face-to-face counseling with the patient and coordination of care, physical exam, and documentation of the encounter.    Alexis Webb,acting as a neurosurgeon for Mickiel Dry, MD.,have documented all relevant documentation on the behalf of Mickiel Dry, MD,as directed by  Mickiel Dry, MD while in the presence of Mickiel Dry, MD.  ***  [facility] [place] - A DEPT OF [facility] [address] [place] [address] Dept: [phone] Dept Fax: [phone]   No orders of the defined types were placed in this encounter.    ONCOLOGY HISTORY:   I have reviewed her chart and materials related to her cancer extensively and collaborated history with the patient. Summary of oncologic history is as follows:   Diagnosis: ***  -05/14/2018: Right breast lumpectomy and SNLB.   Pathology: *** -07/09/2018: Femara  2.5 mg daily -07/15/2018 - 08/06/2018: XRT to right breast   Diagnosis: Left breast invasive carcinoma  -Per documentation, diagnosis was in 2002 with records unavailable. She reportedly underwent lumpectomy followed by mastectomy and then implant.  Patient then received AC followed T therapy. She also had radiation after this. She did not receive adjuvant antiestrogen therapy.   Current Treatment:  ***  INTERVAL HISTORY:   Alexis Webb is here today for follow up and to establish care with me for ***. Patient is accompanied by *** .     I have reviewed the past medical history, past surgical history, social history and family history with the patient and they are unchanged from previous note.  ALLERGIES:  has no known allergies.  MEDICATIONS:  Current Outpatient Medications  Medication Sig Dispense Refill   acetaminophen  (TYLENOL ) 500 MG tablet Take 500 mg by mouth every 6 (six) hours as needed.     Cholecalciferol 50 MCG (2000 UT) TABS Take 4,000 Units by mouth every evening.     DULoxetine  (CYMBALTA ) 60 MG capsule Take 1 capsule (60 mg total) by mouth daily. 30 capsule 11   meloxicam  (MOBIC ) 7.5 MG tablet Take 1-2 tablets (7.5-15 mg total) by mouth daily. 30 tablet 0   rosuvastatin (CRESTOR) 10 MG tablet SMARTSIG:1 Tablet(s) By Mouth Every Evening     No current facility-administered medications for this visit.    REVIEW OF SYSTEMS:   Constitutional: Denies fevers, chills or abnormal weight loss Eyes: Denies blurriness of vision Ears, nose, mouth, throat, and face: Denies mucositis or sore throat Respiratory: Denies cough, dyspnea or wheezes Cardiovascular: Denies palpitation, chest discomfort or lower extremity swelling Gastrointestinal:  Denies nausea, heartburn or change in bowel habits Skin: Denies abnormal skin rashes Lymphatics: Denies new lymphadenopathy or easy bruising Neurological:Denies numbness, tingling or  new weaknesses Behavioral/Psych: Mood is stable, no new changes  All other systems were reviewed with the patient and are negative.   VITALS:  There were no vitals taken for this visit.  Wt Readings from Last 3 Encounters:  12/18/24 208 lb (94.3 kg)  10/27/24 212 lb 15.4 oz (96.6 kg)  11/14/23  233 lb 9.6 oz (106 kg)    There is no height or weight on file to calculate BMI.  Performance status (ECOG): {CHL ONC [id number]  PHYSICAL EXAM:   GENERAL:alert, no distress and comfortable SKIN: skin color, texture, turgor are normal, no rashes or significant lesions EYES: normal, Conjunctiva are pink and non-injected, sclera clear OROPHARYNX:no exudate, no erythema and lips, buccal mucosa, and tongue normal  NECK: supple, thyroid  normal size, non-tender, without nodularity LYMPH:  no palpable lymphadenopathy in the cervical, axillary or inguinal LUNGS: clear to auscultation and percussion with normal breathing effort HEART: regular rate & rhythm and no murmurs and no lower extremity edema ABDOMEN:abdomen soft, non-tender and normal bowel sounds Musculoskeletal:no cyanosis of digits and no clubbing  NEURO: alert & oriented x 3 with fluent speech, no focal motor/sensory deficits  LABORATORY DATA:  I have reviewed the data as listed  No results found for: SPEP, UPEP  Lab Results  Component Value Date   WBC 8.5 10/17/2024   NEUTROABS 6.2 10/17/2024   HGB 12.0 10/17/2024   HCT 38.1 10/17/2024   MCV 93.6 10/17/2024   PLT 197 10/17/2024    @LASTCHEMISTRY @   RADIOGRAPHIC STUDIES: I have personally reviewed the radiological images as listed and agreed with the findings in the report. CT CHEST ABDOMEN PELVIS W CONTRAST EXAM: CT CHEST, ABDOMEN AND PELVIS WITH CONTRAST 12/09/2024 01:30:17 PM  TECHNIQUE: CT of the chest, abdomen and pelvis was performed with the administration of 100 mL Omnipaque  intravenous contrast. Multiplanar reformatted images are provided for review. Automated exposure control, iterative reconstruction, and/or weight based adjustment of the mA/kV was utilized to reduce the radiation dose to as low as reasonably achievable.  COMPARISON: Comparison exam note available.  CLINICAL HISTORY: Third occurrence of breast cancer, metastatic disease  evaluation, Invasive ductal carcinoma of right breast. * Tracking Code: BO *  FINDINGS:  CHEST:  MEDIASTINUM AND LYMPH NODES: Heart and pericardium are unremarkable. The central airways are clear. No central enlarged thoracic lymph nodes. Bilateral axillary nodal dissection. No lymphadenopathy. No metastatic adenopathy of the chest.  LUNGS AND PLEURA: No focal consolidation or pulmonary edema. No suspicious pulmonary nodules. No pleural effusion. No pneumothorax.  ABDOMEN AND PELVIS:  LIVER: Unremarkable.  GALLBLADDER AND BILE DUCTS: Unremarkable. No biliary ductal dilatation.  SPLEEN: No acute abnormality.  PANCREAS: No acute abnormality.  ADRENAL GLANDS: No acute abnormality.  KIDNEYS, URETERS AND BLADDER: No stones in the kidneys or ureters. No hydronephrosis. No perinephric or periureteral stranding. Urinary bladder is unremarkable.  GI AND BOWEL: Stomach demonstrates no acute abnormality. There is no bowel obstruction. No peritoneal or omental nodularity.  REPRODUCTIVE ORGANS: Normal uterus .  PERITONEUM AND RETROPERITONEUM: No ascites. No free air. No peritoneal or omental nodularity.  VASCULATURE: Aorta is normal in caliber.  ABDOMINAL AND PELVIS LYMPH NODES: No lymphadenopathy.  BONES AND SOFT TISSUES: No acute osseous abnormality. No skeletal metastases. Left breast prosthetic. No evidence of breast cancer local recurrence. No distant metastatic disease. No focal soft tissue abnormality.  IMPRESSION: 1. No evidence of breast cancer local recurrence. 2. No metastatic adenopathy of the chest. 3. No distant metastatic disease.  Electronically signed by: Norleen Boxer  MD 12/18/2024 09:01 AM EST RP Workstation: [id number]

## 2024-12-23 ENCOUNTER — Other Ambulatory Visit: Payer: Self-pay | Admitting: *Deleted

## 2024-12-23 ENCOUNTER — Inpatient Hospital Stay: Payer: Medicare (Managed Care) | Attending: Physician Assistant | Admitting: Oncology

## 2024-12-23 VITALS — BP 134/90 | HR 91 | Temp 98.6°F | Resp 18 | Ht 65.0 in | Wt 213.0 lb

## 2024-12-23 DIAGNOSIS — Z86000 Personal history of in-situ neoplasm of breast: Secondary | ICD-10-CM | POA: Diagnosis not present

## 2024-12-23 DIAGNOSIS — G629 Polyneuropathy, unspecified: Secondary | ICD-10-CM

## 2024-12-23 DIAGNOSIS — Z1722 Progesterone receptor negative status: Secondary | ICD-10-CM | POA: Diagnosis not present

## 2024-12-23 DIAGNOSIS — Z853 Personal history of malignant neoplasm of breast: Secondary | ICD-10-CM | POA: Diagnosis not present

## 2024-12-23 DIAGNOSIS — C50911 Malignant neoplasm of unspecified site of right female breast: Secondary | ICD-10-CM | POA: Diagnosis not present

## 2024-12-23 DIAGNOSIS — M8580 Other specified disorders of bone density and structure, unspecified site: Secondary | ICD-10-CM | POA: Diagnosis not present

## 2024-12-23 DIAGNOSIS — N644 Mastodynia: Secondary | ICD-10-CM | POA: Insufficient documentation

## 2024-12-23 DIAGNOSIS — Z1731 Human epidermal growth factor receptor 2 positive status: Secondary | ICD-10-CM | POA: Insufficient documentation

## 2024-12-23 DIAGNOSIS — Z79899 Other long term (current) drug therapy: Secondary | ICD-10-CM | POA: Insufficient documentation

## 2024-12-23 DIAGNOSIS — Z803 Family history of malignant neoplasm of breast: Secondary | ICD-10-CM | POA: Insufficient documentation

## 2024-12-23 DIAGNOSIS — Z171 Estrogen receptor negative status [ER-]: Secondary | ICD-10-CM | POA: Diagnosis not present

## 2024-12-23 MED ORDER — LIDOCAINE-PRILOCAINE 2.5-2.5 % EX CREA: TOPICAL_CREAM | CUTANEOUS | 3 refills | Status: AC

## 2024-12-23 NOTE — Patient Instructions (Signed)
 [facility] at [facility] Discharge Instructions   You were seen and examined today by Dr. Davonna.  She reviewed the results of your biopsy. It is showing that you have a breast cancer that is estrogen receptor negative and HER-2 positive. We will plan to start you on treatment with a combination of chemotherapy drugs and a monoclonal antibody that targets the HER-2 receptor. The chemo drugs are Taxotere and carboplatin. The antibody is called Herceptin. These are given in the clinic every 3 weeks.    Return as scheduled.    Thank you for choosing [facility] at [facility] to provide your oncology and hematology care.  To afford each patient quality time with our provider, please arrive at least 15 minutes before your scheduled appointment time.   If you have a lab appointment with the Cancer Center please come in thru the Main Entrance and check in at the main information desk.  You need to re-schedule your appointment should you arrive 10 or more minutes late.  We strive to give you quality time with our providers, and arriving late affects you and other patients whose appointments are after yours.  Also, if you no show three or more times for appointments you may be dismissed from the clinic at the providers discretion.     Again, thank you for choosing [facility].  Our hope is that these requests will decrease the amount of time that you wait before being seen by our physicians.       _____________________________________________________________  Should you have questions after your visit to [facility], please contact our office at [phone] and follow the prompts.  Our office hours are 8:00 a.m. and 4:30 p.m. Monday - Friday.  Please note that voicemails left after 4:00 p.m. may not be returned until the following business day.  We are closed weekends and major holidays.  You do have access to a nurse 24-7, just  call the main number to the clinic [phone] and do not press any options, hold on the line and a nurse will answer the phone.    For prescription refill requests, have your pharmacy contact our office and allow 72 hours.    Due to Covid, you will need to wear a mask upon entering the hospital. If you do not have a mask, a mask will be given to you at the Main Entrance upon arrival. For doctor visits, patients may have 1 support person age 46 or older with them. For treatment visits, patients can not have anyone with them due to social distancing guidelines and our immunocompromised population.

## 2024-12-26 ENCOUNTER — Other Ambulatory Visit: Payer: Self-pay

## 2024-12-26 ENCOUNTER — Encounter (HOSPITAL_COMMUNITY): Payer: Self-pay

## 2024-12-30 ENCOUNTER — Encounter (HOSPITAL_COMMUNITY)
Admission: RE | Admit: 2024-12-30 | Discharge: 2024-12-30 | Disposition: A | Discharge status: Home / Self Care | Source: Ambulatory Visit | Attending: Surgery | Admitting: Surgery

## 2025-01-02 ENCOUNTER — Encounter (HOSPITAL_COMMUNITY): Payer: Self-pay | Admitting: Surgery

## 2025-01-02 ENCOUNTER — Ambulatory Visit (HOSPITAL_COMMUNITY): Admitting: Certified Registered"

## 2025-01-02 ENCOUNTER — Observation Stay (HOSPITAL_COMMUNITY): Admission: RE | Admit: 2025-01-02 | Discharge: 2025-01-03 | Disposition: A | Attending: Surgery | Admitting: Surgery

## 2025-01-02 ENCOUNTER — Observation Stay (HOSPITAL_COMMUNITY)

## 2025-01-02 ENCOUNTER — Encounter (HOSPITAL_COMMUNITY): Admission: RE | Payer: Self-pay | Source: Home / Self Care

## 2025-01-02 ENCOUNTER — Ambulatory Visit (HOSPITAL_COMMUNITY)

## 2025-01-02 ENCOUNTER — Other Ambulatory Visit: Payer: Self-pay

## 2025-01-02 DIAGNOSIS — Z79899 Other long term (current) drug therapy: Secondary | ICD-10-CM | POA: Insufficient documentation

## 2025-01-02 DIAGNOSIS — I10 Essential (primary) hypertension: Secondary | ICD-10-CM | POA: Insufficient documentation

## 2025-01-02 DIAGNOSIS — Z853 Personal history of malignant neoplasm of breast: Secondary | ICD-10-CM | POA: Insufficient documentation

## 2025-01-02 DIAGNOSIS — Z9011 Acquired absence of right breast and nipple: Principal | ICD-10-CM

## 2025-01-02 DIAGNOSIS — C50911 Malignant neoplasm of unspecified site of right female breast: Principal | ICD-10-CM | POA: Insufficient documentation

## 2025-01-02 LAB — GLUCOSE, CAPILLARY: Glucose-Capillary: 135 mg/dL — ABNORMAL HIGH (ref 70–99)

## 2025-01-02 MED ORDER — CHLORHEXIDINE GLUCONATE CLOTH 2 % EX PADS
6.0000 | MEDICATED_PAD | Freq: Once | CUTANEOUS | Status: AC
  Administered 2025-01-02: 6 via TOPICAL

## 2025-01-02 MED ORDER — ONDANSETRON HCL 4 MG/2ML IJ SOLN: 4.0000 mg | Freq: Once | INTRAMUSCULAR | Status: DC | PRN

## 2025-01-02 MED ORDER — HYDROMORPHONE HCL 1 MG/ML IJ SOLN
INTRAMUSCULAR | Status: DC | PRN
  Administered 2025-01-02: 1 mg via INTRAVENOUS

## 2025-01-02 MED ORDER — CEFAZOLIN SODIUM-DEXTROSE 2-4 GM/100ML-% IV SOLN
2.0000 g | INTRAVENOUS | Status: AC
  Administered 2025-01-02: 2 g via INTRAVENOUS

## 2025-01-02 MED ORDER — HYDROMORPHONE HCL 1 MG/ML IJ SOLN
INTRAMUSCULAR | Status: AC
  Filled 2025-01-02: qty 0.5

## 2025-01-02 MED ORDER — VITAMIN D 25 MCG (1000 UNIT) PO TABS
4000.0000 [IU] | ORAL_TABLET | Freq: Every evening | ORAL | Status: DC
  Filled 2025-01-02: qty 4

## 2025-01-02 MED ORDER — ENOXAPARIN SODIUM 40 MG/0.4ML IJ SOSY
PREFILLED_SYRINGE | INTRAMUSCULAR | Status: AC
  Administered 2025-01-02: 40 mg via SUBCUTANEOUS
  Filled 2025-01-02: qty 0.4

## 2025-01-02 MED ORDER — OXYCODONE HCL 5 MG PO TABS: 5.0000 mg | ORAL_TABLET | Freq: Once | ORAL | Status: DC | PRN

## 2025-01-02 MED ORDER — HEPARIN SOD (PORK) LOCK FLUSH 100 UNIT/ML IV SOLN
INTRAVENOUS | Status: DC | PRN
  Administered 2025-01-02: 500 [IU] via INTRAVENOUS

## 2025-01-02 MED ORDER — METHOCARBAMOL 500 MG PO TABS: 500.0000 mg | ORAL_TABLET | Freq: Three times a day (TID) | ORAL | 0 refills | Status: AC

## 2025-01-02 MED ORDER — BUPIVACAINE HCL (PF) 0.5 % IJ SOLN
INTRAMUSCULAR | Status: DC | PRN
  Administered 2025-01-02: 30 mL

## 2025-01-02 MED ORDER — ONDANSETRON HCL 4 MG/2ML IJ SOLN
INTRAMUSCULAR | Status: AC
  Filled 2025-01-02: qty 2

## 2025-01-02 MED ORDER — PROPOFOL 500 MG/50ML IV EMUL
INTRAVENOUS | Status: AC
  Filled 2025-01-02: qty 50

## 2025-01-02 MED ORDER — ONDANSETRON 4 MG PO TBDP: 4.0000 mg | ORAL_TABLET | Freq: Four times a day (QID) | ORAL | Status: DC | PRN

## 2025-01-02 MED ORDER — PHENYLEPHRINE 80 MCG/ML (10ML) SYRINGE FOR IV PUSH (FOR BLOOD PRESSURE SUPPORT)
PREFILLED_SYRINGE | INTRAVENOUS | Status: AC
  Filled 2025-01-02: qty 10

## 2025-01-02 MED ORDER — DEXAMETHASONE SOD PHOSPHATE PF 10 MG/ML IJ SOLN
INTRAMUSCULAR | Status: DC | PRN
  Administered 2025-01-02: 10 mg via INTRAVENOUS

## 2025-01-02 MED ORDER — SODIUM CHLORIDE (PF) 0.9 % IJ SOLN
INTRAMUSCULAR | Status: DC | PRN
  Administered 2025-01-02: 500 mL via INTRAVENOUS

## 2025-01-02 MED ORDER — DEXAMETHASONE SOD PHOSPHATE PF 10 MG/ML IJ SOLN: INTRAMUSCULAR | Status: DC | PRN

## 2025-01-02 MED ORDER — CHLORHEXIDINE GLUCONATE 0.12 % MT SOLN
15.0000 mL | Freq: Once | OROMUCOSAL | Status: AC
  Administered 2025-01-02: 15 mL via OROMUCOSAL

## 2025-01-02 MED ORDER — OXYCODONE HCL 5 MG/5ML PO SOLN: 5.0000 mg | Freq: Once | ORAL | Status: DC | PRN

## 2025-01-02 MED ORDER — ONDANSETRON HCL 4 MG/2ML IJ SOLN
INTRAMUSCULAR | Status: DC | PRN
  Administered 2025-01-02: 4 mg via INTRAVENOUS

## 2025-01-02 MED ORDER — ONDANSETRON HCL 4 MG PO TABS: 4.0000 mg | ORAL_TABLET | Freq: Three times a day (TID) | ORAL | 0 refills | Status: AC | PRN

## 2025-01-02 MED ORDER — DEXAMETHASONE SOD PHOSPHATE PF 10 MG/ML IJ SOLN
INTRAMUSCULAR | Status: AC
  Filled 2025-01-02: qty 1

## 2025-01-02 MED ORDER — CEFAZOLIN SODIUM-DEXTROSE 2-4 GM/100ML-% IV SOLN
INTRAVENOUS | Status: AC
  Filled 2025-01-02: qty 100

## 2025-01-02 MED ORDER — LACTATED RINGERS IV SOLN: INTRAVENOUS | Status: DC

## 2025-01-02 MED ORDER — SCOPOLAMINE 1 MG/3DAYS TD PT72
MEDICATED_PATCH | TRANSDERMAL | Status: AC
  Filled 2025-01-02: qty 1

## 2025-01-02 MED ORDER — MIDAZOLAM HCL (PF) 2 MG/2ML IJ SOLN
INTRAMUSCULAR | Status: DC | PRN
  Administered 2025-01-02: 2 mg via INTRAVENOUS

## 2025-01-02 MED ORDER — DEXMEDETOMIDINE HCL IN NACL 80 MCG/20ML IV SOLN
INTRAVENOUS | Status: AC
  Filled 2025-01-02: qty 40

## 2025-01-02 MED ORDER — LIDOCAINE HCL (PF) 1 % IJ SOLN
INTRAMUSCULAR | Status: DC | PRN
  Administered 2025-01-02: 10 mL

## 2025-01-02 MED ORDER — ACETAMINOPHEN 10 MG/ML IV SOLN
INTRAVENOUS | Status: AC
  Filled 2025-01-02: qty 100

## 2025-01-02 MED ORDER — ACETAMINOPHEN 10 MG/ML IV SOLN
INTRAVENOUS | Status: DC | PRN
  Administered 2025-01-02: 1000 mg via INTRAVENOUS

## 2025-01-02 MED ORDER — OXYCODONE HCL 5 MG PO TABS: 5.0000 mg | ORAL_TABLET | Freq: Four times a day (QID) | ORAL | 0 refills | Status: AC | PRN

## 2025-01-02 MED ORDER — PROPOFOL 500 MG/50ML IV EMUL
INTRAVENOUS | Status: DC | PRN
  Administered 2025-01-02: 50 mg via INTRAVENOUS
  Administered 2025-01-02: 150 mg via INTRAVENOUS
  Administered 2025-01-02: 25 ug/kg/min via INTRAVENOUS

## 2025-01-02 MED ORDER — SODIUM CHLORIDE (PF) 0.9 % IJ SOLN
INTRAMUSCULAR | Status: AC
  Filled 2025-01-02: qty 10

## 2025-01-02 MED ORDER — ROCURONIUM BROMIDE 10 MG/ML (PF) SYRINGE
PREFILLED_SYRINGE | INTRAVENOUS | Status: AC
  Filled 2025-01-02: qty 10

## 2025-01-02 MED ORDER — DOCUSATE SODIUM 100 MG PO CAPS: 100.0000 mg | ORAL_CAPSULE | Freq: Two times a day (BID) | ORAL | 2 refills | Status: AC

## 2025-01-02 MED ORDER — SUCCINYLCHOLINE CHLORIDE 200 MG/10ML IV SOSY
PREFILLED_SYRINGE | INTRAVENOUS | Status: AC
  Filled 2025-01-02: qty 10

## 2025-01-02 MED ORDER — ACETAMINOPHEN 500 MG PO TABS
1000.0000 mg | ORAL_TABLET | Freq: Four times a day (QID) | ORAL | Status: DC
  Filled 2025-01-02 (×2): qty 2

## 2025-01-02 MED ORDER — LIDOCAINE HCL (PF) 1 % IJ SOLN
INTRAMUSCULAR | Status: AC
  Filled 2025-01-02: qty 30

## 2025-01-02 MED ORDER — OXYCODONE HCL 5 MG PO TABS: 5.0000 mg | ORAL_TABLET | ORAL | Status: DC | PRN

## 2025-01-02 MED ORDER — ONDANSETRON HCL 4 MG/2ML IJ SOLN
4.0000 mg | Freq: Four times a day (QID) | INTRAMUSCULAR | Status: DC | PRN
  Administered 2025-01-02 – 2025-01-03 (×3): 4 mg via INTRAVENOUS
  Filled 2025-01-02 (×3): qty 2

## 2025-01-02 MED ORDER — MIDAZOLAM HCL 2 MG/2ML IJ SOLN
INTRAMUSCULAR | Status: AC
  Filled 2025-01-02: qty 2

## 2025-01-02 MED ORDER — LIDOCAINE 2% (20 MG/ML) 5 ML SYRINGE
INTRAMUSCULAR | Status: DC | PRN
  Administered 2025-01-02: 80 mg via INTRAVENOUS

## 2025-01-02 MED ORDER — HYDROMORPHONE HCL 1 MG/ML IJ SOLN: 0.5000 mg | INTRAMUSCULAR | Status: DC | PRN

## 2025-01-02 MED ORDER — PHENYLEPHRINE 80 MCG/ML (10ML) SYRINGE FOR IV PUSH (FOR BLOOD PRESSURE SUPPORT)
PREFILLED_SYRINGE | INTRAVENOUS | Status: DC | PRN
  Administered 2025-01-02 (×2): 160 ug via INTRAVENOUS
  Administered 2025-01-02: 80 ug via INTRAVENOUS
  Administered 2025-01-02: 160 ug via INTRAVENOUS

## 2025-01-02 MED ORDER — ROCURONIUM BROMIDE 10 MG/ML (PF) SYRINGE
PREFILLED_SYRINGE | INTRAVENOUS | Status: DC | PRN
  Administered 2025-01-02: 10 mg via INTRAVENOUS

## 2025-01-02 MED ORDER — HYDRALAZINE HCL 20 MG/ML IJ SOLN
INTRAMUSCULAR | Status: AC
  Filled 2025-01-02: qty 1

## 2025-01-02 MED ORDER — EPHEDRINE SULFATE (PRESSORS) 25 MG/5ML IV SOSY
PREFILLED_SYRINGE | INTRAVENOUS | Status: DC | PRN
  Administered 2025-01-02: 10 mg via INTRAVENOUS

## 2025-01-02 MED ORDER — EPHEDRINE 5 MG/ML INJ
INTRAVENOUS | Status: AC
  Filled 2025-01-02: qty 5

## 2025-01-02 MED ORDER — DULOXETINE HCL 60 MG PO CPEP
60.0000 mg | ORAL_CAPSULE | Freq: Every day | ORAL | Status: DC
  Administered 2025-01-02 – 2025-01-03 (×2): 60 mg via ORAL
  Filled 2025-01-02 (×2): qty 1

## 2025-01-02 MED ORDER — FENTANYL CITRATE (PF) 50 MCG/ML IJ SOSY: 25.0000 ug | PREFILLED_SYRINGE | INTRAMUSCULAR | Status: DC | PRN

## 2025-01-02 MED ORDER — PHENYLEPHRINE HCL-NACL 20-0.9 MG/250ML-% IV SOLN
INTRAVENOUS | Status: DC | PRN
  Administered 2025-01-02: 15 ug/min via INTRAVENOUS

## 2025-01-02 MED ORDER — 0.9 % SODIUM CHLORIDE (POUR BTL) OPTIME
TOPICAL | Status: DC | PRN
  Administered 2025-01-02: 1000 mL

## 2025-01-02 MED ORDER — PHENYLEPHRINE HCL-NACL 20-0.9 MG/250ML-% IV SOLN
INTRAVENOUS | Status: AC
  Filled 2025-01-02: qty 250

## 2025-01-02 MED ORDER — MAGTRACE LYMPHATIC TRACER
INTRAMUSCULAR | Status: DC | PRN
  Administered 2025-01-02: 2 mL via INTRAMUSCULAR

## 2025-01-02 MED ORDER — METHOCARBAMOL 500 MG PO TABS
500.0000 mg | ORAL_TABLET | Freq: Three times a day (TID) | ORAL | Status: DC
  Administered 2025-01-02 – 2025-01-03 (×2): 500 mg via ORAL
  Filled 2025-01-02 (×2): qty 1

## 2025-01-02 MED ORDER — SUCCINYLCHOLINE CHLORIDE 200 MG/10ML IV SOSY
PREFILLED_SYRINGE | INTRAVENOUS | Status: DC | PRN
  Administered 2025-01-02: 120 mg via INTRAVENOUS

## 2025-01-02 MED ORDER — FENTANYL CITRATE (PF) 250 MCG/5ML IJ SOLN
INTRAMUSCULAR | Status: AC
  Filled 2025-01-02: qty 5

## 2025-01-02 MED ORDER — FENTANYL CITRATE (PF) 250 MCG/5ML IJ SOLN
INTRAMUSCULAR | Status: DC | PRN
  Administered 2025-01-02: 50 ug via INTRAVENOUS
  Administered 2025-01-02 (×2): 100 ug via INTRAVENOUS

## 2025-01-02 MED ORDER — ENOXAPARIN SODIUM 40 MG/0.4ML IJ SOSY
40.0000 mg | PREFILLED_SYRINGE | INTRAMUSCULAR | Status: DC
  Administered 2025-01-03: 40 mg via SUBCUTANEOUS
  Filled 2025-01-02: qty 0.4

## 2025-01-02 MED ORDER — HEPARIN SOD (PORK) LOCK FLUSH 100 UNIT/ML IV SOLN
INTRAVENOUS | Status: AC
  Filled 2025-01-02: qty 5

## 2025-01-02 MED ORDER — ENOXAPARIN SODIUM 40 MG/0.4ML IJ SOSY: 40.0000 mg | PREFILLED_SYRINGE | Freq: Once | INTRAMUSCULAR | Status: AC

## 2025-01-02 MED ORDER — DEXMEDETOMIDINE HCL IN NACL 80 MCG/20ML IV SOLN
INTRAVENOUS | Status: DC | PRN
  Administered 2025-01-02: 20 ug via INTRAVENOUS

## 2025-01-02 MED ORDER — BUPIVACAINE HCL (PF) 0.5 % IJ SOLN
INTRAMUSCULAR | Status: AC
  Filled 2025-01-02: qty 30

## 2025-01-02 MED ORDER — ROSUVASTATIN CALCIUM 10 MG PO TABS
10.0000 mg | ORAL_TABLET | Freq: Every day | ORAL | Status: DC
  Administered 2025-01-03: 10 mg via ORAL
  Filled 2025-01-02 (×2): qty 1

## 2025-01-02 MED ORDER — GLYCOPYRROLATE PF 0.2 MG/ML IJ SOSY
PREFILLED_SYRINGE | INTRAMUSCULAR | Status: AC
  Filled 2025-01-02: qty 1

## 2025-01-02 MED ORDER — ACETAMINOPHEN 500 MG PO TABS: 1000.0000 mg | ORAL_TABLET | Freq: Four times a day (QID) | ORAL | 0 refills | Status: AC

## 2025-01-02 MED ORDER — LIDOCAINE 2% (20 MG/ML) 5 ML SYRINGE
INTRAMUSCULAR | Status: AC
  Filled 2025-01-02: qty 5

## 2025-01-02 MED ORDER — SCOPOLAMINE 1 MG/3DAYS TD PT72
1.0000 | MEDICATED_PATCH | TRANSDERMAL | Status: DC
  Administered 2025-01-02: 1 mg via TRANSDERMAL

## 2025-01-02 NOTE — Discharge Instructions (Signed)
 Discharge instructions after breast surgery:   Common Complaints: Pain and bruising at the incision sites.  Swelling at the incision sites. Stiffness of the arm.   Diet/ Activity: Diet as tolerated.  You may shower but do not take hot showers as this can disrupt the glue. Rest and listen to your body, but do not remain in bed all day.  Walk everyday for at least 15-20 minutes. Deep cough and move around every 1-2 hours in the first few days after surgery.  Do not lift > 10 lbs for the first 2 weeks after surgery. Do not do anything that makes you feel like you are putting unnecessary pull or stretch on the incision sites.  Do move your arm and shoulder (see exercises options below). If you do not move then you can get stiff and hurt more.  Do not pick at the dermabond glue on your incision sites.  This glue film will remain in place for 1-2 weeks and will start to peel off.  Do not place lotions or balms on your incision unless instructed to specifically by Dr. Evonnie.   Pain Expectations and Narcotics: -After surgery you will have pain associated with your incisions and this is normal. The pain is muscular and nerve pain, and will get better with time. -You are encouraged and expected to take non narcotic medications like tylenol  and ibuprofen (when able) to treat pain as multiple modalities can aid with pain treatment. -Narcotics are only used when pain is severe or there is breakthrough pain. -You are not expected to have a pain score of 0 after surgery, as we cannot prevent pain. A pain score of 3-4 that allows you to be functional, move, walk, and tolerate some activity is the goal. The pain will continue to improve over the days after surgery and is dependent on your surgery. -Due to [place] law, we are only able to give a certain amount of pain medication to treat post operative pain, and we only give additional narcotics on a patient by patient basis.  -For most laparoscopic surgery,  studies have shown that the majority of patients only need 10-15 narcotic pills, and for open surgeries most patients only need 15-20.   -Having appropriate expectations of pain and knowledge of pain management with non narcotics is important as we do not want anyone to become addicted to narcotic pain medication.  -Using ice packs in the first 48 hours and heating pads after 48 hours, wearing an abdominal binder (when recommended), and using over the counter medications are all ways to help with pain management.   -Simple acts like meditation and mindfulness practices after surgery can also help with pain control and research has proven the benefit of these practices.  Medication: Take tylenol  and ibuprofen as needed for pain control, alternating every 4-6 hours.  Example:  Tylenol  1000mg  @ 6am, 12noon, 6pm, (Do not exceed 4000mg  of tylenol  a day). Ibuprofen 800mg  @ 9am, 3pm, 9pm, 3am (Do not exceed 3600mg  of ibuprofen a day).  Take Roxicodone  for breakthrough pain every 4 hours.  Take Colace for constipation related to narcotic pain medication. If you do not have a bowel movement in 2 days, take Miralax  over the counter.  Drink plenty of water to also prevent constipation.   Contact Information: If you have questions or concerns, please call our office, [phone], Monday- Thursday 8AM-5PM and Friday 8AM-12Noon.  If it is after hours or on the weekend, please call [facility], [phone], and  ask to speak to the surgeon on call for Dr. Evonnie at [facility].   Exercises After Breast Surgery Do at least a few of the exercises below twice a day. It is ok to start the day after surgery and gradually build up the amount and type of exercises you do. Link to the exercises with pictures (relaythis.com.au).   Deep Breathing Exercise Deep breathing can help you relax and ease discomfort and tightness around  your incision (surgical cut). Its also a very good way to relieve stress during the day.  Sit comfortably in a chair. Take a slow, deep breath through your nose. Let your chest and belly expand. Breathe out slowly through your mouth. Repeat as many times as needed.  Arm and Shoulder Exercises Doing arm and shoulder exercises will help you get back your full range of motion on your affected side (the side where you had your surgery). With full range of motion, youll be able to: Move your arm over your head and out to the side Move your arm behind your neck Move your arm to the middle of your back Do each of the exercises below 5 times a day. Keep doing this until you have a full range of motion again and can use your arm as you did before surgery in all your normal activities. This includes activities at work, at home, and in recreation or sports. If you had limited movement in your arm before surgery, your goal will be to get back as much movement as you had before.  If you get your full range of motion back quickly, keep doing these exercises once a day instead of 5 times a day. This is especially true if you feel any tightness in your chest, shoulder, or under your affected arm. These exercises can help keep scar tissue from forming in your armpit and shoulder. Scar tissue can limit your arm movements later.  If you still have trouble moving your shoulder 4 weeks after your surgery, tell your surgeon. Theyll tell you if you need more rehabilitation, such as physical or occupational therapy.  If you had one of the following surgeries, you can do the following set of exercises on the first day after your surgery, as long as your surgeon tells you its safe.  Shoulder rolls The shoulder roll is a good exercise to start with because it gently stretches your chest and shoulder muscles.  Stand or sit comfortably with your arms relaxed at your sides. Start with backward shoulder rolls. In a  circular motion, bring your shoulders forward, up, backward, and down. Do this 10 times. Switch directions and do 10 forward shoulder rolls. Bring your shoulders backward, up, forward, and down. Do this 10 times. Try to make the circles as big as you can and move both shoulders at the same time. If you have some tightness across your incision or chest, start with smaller circles and make them bigger as the tightness decreases. The backward direction might feel a little tighter across your chest than the forward direction. This will get better with practice.  Shoulder wings The shoulder wings exercise will help you get back outward movement of your shoulder. You can do this exercise while sitting or standing.  Place your hands on your chest or collarbone. Raise your elbows out to the side, limiting your range of motion as instructed by your healthcare team. Slowly lower your elbows. Do this 10 times. Then, slowly lower your hands. If you feel discomfort  while doing this exercise, hold your position and do the deep breathing exercise. If the discomfort passes, raise your elbows a little higher. If it doesnt pass, dont raise your elbows any higher. Finish the exercise raising your elbows only high enough to feel a gentle stretch and no discomfort.  Arm circles If you had surgery on both breasts, do this exercise with both arms, 1 arm at a time. Dont do this exercise with both arms at the same time. This will put too much pressure on your chest.  Stand with your feet slightly apart for balance. Raise your affected arm out to the side as high as you can, limiting your range of movement as instructed by your healthcare team. Start making slow, backward circles in the air with your arm. Make sure youre moving your arm from your shoulder, not your elbow. Keep your elbow straight. Increase the size of the circles until theyre as big as you can comfortably make them, limiting your range of motion as  instructed by your healthcare team. If you feel any aching or if your arm is tired, take a break. Keep doing the exercise when you feel better. Do 10 full backward circles. Then, slowly lower your arm to your side. Rest your arm for a moment. Follow steps 1 to 4 again, but this time make slow, forward circles.  W exercise You can do the W exercise while sitting or standing.  Form a W with your arms out to the side and palms facing forward (see Figure 4). Try to bring your hands up so theyre even with your face. If you cant raise your arms that high, bring them to the highest comfortable position. Make sure to limit your range of motion as instructed by your healthcare team. Pinch your shoulder blades together and downward, as if youre squeezing a pencil between them. If you feel discomfort, stop at that position and do the deep breathing exercise. If the discomfort passes, try to bring your arms back a little further. If it doesnt pass, dont reach any further. Hold the furthest position that doesnt cause discomfort. Squeeze your shoulder blades together and downward for 5 seconds. Slowly bring your arms back down to the starting position. Repeat this movement 10 times.  Back Climb You can do the back climb stretch while sitting or standing. Youll need a timer or stopwatch.  Place your hands behind your back. Hold the hand on your affected side with your other hand. If you had surgery on both breasts, use the arm that moves most easily to hold the other. Slowly slide your hands up the center of your back as far as you can. If you feel tightness near your incision, stop at that position and do the deep breathing exercise. If the tightness decreases, try to slide your hands up a little further. If it doesnt decrease, dont slide your hands up any further. Hold the highest position you can for 1 minute. Use your stopwatch or timer to keep track. You should feel a gentle stretch in your  shoulder area. After 1 minute, slowly lower your hands.  Hands behind neck You can do the hands behind neck stretch while sitting or standing. Youll need a timer or stopwatch.  Clasp your hands together on your lap or in front of you. Slowly raise your hands toward your head, keeping your elbows together in front of you, not out to the sides. Keep your head level. Dont bend your neck or head  forward. Slide your hands over your head until you reach the back of your neck. When you get to this point, spread your elbows out to the sides. Hold this position for 1 minute. Use your stopwatch or timer to keep track. Breathe normally. Dont hold your breath as you stretch your body. If you have some tightness across your incision or chest, hold your position and do the deep breathing exercise. If the tightness decreases, continue with the movement. If the tightness stays the same, reach up and stretch your elbows back as best as you can without causing discomfort. Hold the position youre most comfortable in for 1 minute. Slowly come out of the stretch by bringing your elbows together and sliding your hands over your head. Then, slowly lower your arms.  Forward wall crawls Youll need 2 pieces of tape for the forward wall crawl exercise.  Stand facing a wall. Your toes should be about 6 inches (15 centimeters) from the wall. Reach as high as you can with your unaffected arm. Oneil that point with a piece of tape. This will be the goal for your affected arm. If you had surgery on both breasts, set your goal using the arm that moves most comfortably. Place both hands against the wall at a level thats comfortable. Crawl your fingers up the wall as far as you can, keeping them even with each other.. Try not to look up toward your hands or arch your back. When you get to the point where you feel a good stretch, but not pain, do the deep breathing exercise. Return to the starting position by crawling your  fingers back down the wall. Repeat the wall crawl 10 times. Each time you raise your hands, try to crawl a little bit higher. On the 10th crawl, use the other piece of tape to mark the highest point you reached with your affected arm. This will let you to see your progress each time you do this exercise. As you become more flexible, you may need to take a step closer to the wall so you can reach a little higher.   Side wall crawls Youll also need 2 pieces of tape for the side wall crawl exercise.  You shouldnt feel pain while doing this exercise. Its normal to feel some tightness or pulling across the side of your chest. Focus on your breathing until the tightness decreases. Breathe normally throughout this exercise. Dont hold your breath.  Be careful not to turn your body toward the wall while doing this exercise. Make sure only the side of your body faces the wall.  If you had surgery on both breasts, start with step 3.  Stand with your unaffected side closest to the wall, about 1 foot (30.5 centimeters) away from the wall. Reach as high as you can with your unaffected arm. Oneil that point with a piece of tape (see Figure 8). This will be the goal for your affected arm. Turn your body so your affected side is now closest to the wall. If you had surgery on both breasts, start with either side closest to the wall. Crawl your fingers up the wall as far as you can. When you get to the point where you feel a good stretch, but not pain, do the deep breathing exercise. Return to the starting position by crawling your fingers back down the wall. Repeat this exercise 10 times. On your 10th crawl, use a piece of tape to mark the highest point you reached  with your affected arm. This will let you see your progress each time you do the exercise. If you had surgery on both breasts, repeat the exercise with your other arm.  Swelling After your surgery, you may have some swelling or puffiness in your  hand or arm on your affected side. This is normal and usually goes away on its own.  If you notice swelling in your hand or arm, follow the tips below to help the swelling go away.  Raise your arm above your head and do hand pumps several times a day. To do hand pumps, slowly open and close your fist 10 times. This will help drain the fluid out of your arm. Dont hold your arm straight up over your head for more than a few minutes. This can cause your arm muscles to get tired. Raise your arm to the side a few times a day for about 20 minutes at a time. To do this, sit or lie down on your back. Rest your arm on a few pillows next to you so its raised above the level of your heart. If youre able to sleep on your unaffected side, you can place 1 or 2 pillows in front of you and rest your affected arm on them while you sleep. If the swelling doesnt go down within 4 to 6 weeks, call your surgeon or nurse.  Patient Instructions for Emptying Bulb (JP) Drains   Wash hands thoroughly with soap and water.  Unfasten drain from clothing. Use the markings on the bulb or a plastic measuring cup marked in cc or ml.  Remove the drainage plug from drainage spout. Avoid touching the inside portion of the plug.  Turn bulb upside down over the container.  Squeeze the bulb gently to remove the fluid.  Wipe spout and drainage plug with an alcohol wipe.  Turn the bulb right side up.  Squeeze and collapse the bulb until it is flat.  Carefully place drainage plug back into spout and fasten bulb to clothing.  Record the amount of drainage, time and date on the second page.  If the bulb becomes filled with fluid or becomes oval in shape (instead of collapsed-looking), follow steps 1-10 again.  Each day, gently clean the skin around the drain with soap and water, using a clean wash cloth each time. CALL YOU DOCTOR IF YOUR SKIN BECOMES RED, SORE OR SWOLLEN.   Strip the drains with alcohol every 4-8  hours to keep the fluid flowing.  Please feel free to call your doctors office if you have any questions or concerns.   Date:   Time Amount                             Date:   Time Amount                            Date:   Time Amount                            Date:   Time Amount                             Date:   Time Amount  Date:   Time Amount                            Date:   Time Amount                            Date:   Time Amount

## 2025-01-02 NOTE — Transfer of Care (Signed)
 Immediate Anesthesia Transfer of Care Note  Patient: Alexis Webb  Procedure(s) Performed: MASTECTOMY WITH AXILLARY SENTINEL LYMPH NODE BIOPSY (Right: Breast) INSERTION, TUNNELED CENTRAL VENOUS DEVICE, WITH PORT (Left: Chest)  Patient Location: PACU  Anesthesia Type:General  Level of Consciousness: drowsy and patient cooperative  Airway & Oxygen Therapy: Patient Spontanous Breathing and Patient connected to face mask oxygen  Post-op Assessment: Report given to RN and Post -op Vital signs reviewed and stable  Post vital signs: Reviewed and stable  Last Vitals:  Vitals Value Taken Time  BP 101/55 01/02/25 15:00  Temp 36.5 C 01/02/25 14:50  Pulse 82 01/02/25 15:03  Resp 10 01/02/25 15:03  SpO2 100 % 01/02/25 15:03  Vitals shown include unfiled device data.  Last Pain:  Vitals:   01/02/25 1450  PainSc: Asleep         Complications: No notable events documented.

## 2025-01-02 NOTE — Progress Notes (Signed)
 Chest xray done.

## 2025-01-02 NOTE — Anesthesia Preprocedure Evaluation (Addendum)
"                                    Anesthesia Evaluation  Patient identified by MRN, date of birth, ID band Patient awake    Reviewed: Allergy & Precautions, H&P , NPO status , Patient's Chart, lab work & pertinent test results, reviewed documented beta blocker date and time   History of Anesthesia Complications (+) PONV and history of anesthetic complications  Airway Mallampati: II  TM Distance: >3 FB Neck ROM: full    Dental no notable dental hx.    Pulmonary neg pulmonary ROS   Pulmonary exam normal breath sounds clear to auscultation       Cardiovascular Exercise Tolerance: Good hypertension, negative cardio ROS  Rhythm:regular Rate:Normal     Neuro/Psych negative neurological ROS  negative psych ROS   GI/Hepatic negative GI ROS, Neg liver ROS,,,  Endo/Other  negative endocrine ROS    Renal/GU negative Renal ROS  negative genitourinary   Musculoskeletal   Abdominal   Peds  Hematology  (+) Blood dyscrasia, anemia   Anesthesia Other Findings   Reproductive/Obstetrics negative OB ROS                              Anesthesia Physical Anesthesia Plan  ASA: 2  Anesthesia Plan: General, General LMA and General ETT   Post-op Pain Management:    Induction:   PONV Risk Score and Plan: Ondansetron   Airway Management Planned:   Additional Equipment:   Intra-op Plan:   Post-operative Plan:   Informed Consent: I have reviewed the patients History and Physical, chart, labs and discussed the procedure including the risks, benefits and alternatives for the proposed anesthesia with the patient or authorized representative who has indicated his/her understanding and acceptance.     Dental Advisory Given  Plan Discussed with: CRNA  Anesthesia Plan Comments:          Anesthesia Quick Evaluation  "

## 2025-01-02 NOTE — Progress Notes (Signed)
 Patient assisted up to bedside commode.  Very lethargic had to have 2 nurses assist.

## 2025-01-02 NOTE — Interval H&P Note (Signed)
 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  The various methods of treatment have been discussed with the patient and family. After consideration of risks, benefits and other options for treatment, the patient has consented to  Procedures: MASTECTOMY WITH SENTINEL LYMPH NODE BIOPSY (Right) INSERTION, TUNNELED CENTRAL VENOUS DEVICE, WITH PORT (Left) as a surgical intervention.  The patient's history has been reviewed, patient examined, no change in status, stable for surgery.  I have reviewed the patient's chart and labs.  Questions were answered to the patient's satisfaction.     Ajia Chadderdon A Floris Neuhaus

## 2025-01-02 NOTE — Progress Notes (Signed)
 [facility]  Spoke with the patient's family in the consultation room.  I explained that she tolerated the procedure without difficulty.  She has dissolvable stitches under the skin with overlying skin glue at her port incision site.  This will flake off in 10 to 14 days.  She has skin staples at her mastectomy incision site which will be removed at her follow up visit in 2 weeks.  I have sent in a prescription for narcotic pain medication that they should take as needed for pain.  I also want her taking scheduled Tylenol  and robaxin .  If they take the narcotic pain medication, they should take a stool softener as well.  Pending how she is feeling this afternoon will determine if she goes home today vs tomorrow.  All questions were answered to their expressed satisfaction.  Plan: -Admit to observation -Regular diet -JP drain care -Schedule Tylenol  and Robaxin ; PRN roxicodone  and dilaudid  -AM labs -PRN antiemetics -Anticipate discharge home possibly later today vs tomorrow  Dorothyann Brittle, DO [facility] [address] [place], [place] [address] [phone] (office)

## 2025-01-02 NOTE — Op Note (Signed)
 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   Postoperative Diagnosis: Same   Procedure(s) Performed: Right breast mastectomy with right axillary sentinel lymph node biopsy; Left internal jugular port-a-cath insertion   Surgeon: Dorothyann Brittle, DO    Assistants: Montie Seltzer, RNFA   Anesthesia: General endotracheal   Anesthesiologist: Kendell Yvonna PARAS, MD    Specimens: Right breast mass (short stitch superior, long stitch lateral); right breast posterior margin posterior to palpable mass; right axillary sentinel lymph node measuring 880; right axillary lymph node measuring 182    Estimated Blood Loss: 60 cc   Blood Replacement: None    Complications: None   Wound Class: Clean   Operative Indications: Patient is a 59 year old female who presents for right breast mastectomy with right axillary sentinel lymph node biopsy and left internal jugular Port-A-Cath insertion.  She was recently noted to have an abnormality on her right breast mammogram, and biopsies have come back with invasive ductal carcinoma.  She has a significant medical history of left-sided breast cancer status post mastectomy and sentinel lymph node biopsy.  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  She is agreeable to surgery at this time.  All risks and benefits of performing this procedure were discussed with the patient including pain, infection, bleeding, damage to the surrounding structures, and need for more procedures or surgery. The patient voiced understanding of the procedure, all questions were sought and answered, and consent was obtained.  Findings: - Right breast moved in its entirety without issue, short stitch marking superior, long stitch marking lateral - Area of palpable concern on the posterior breast at the 4-5 o'clock region, so additional  posterior margin was removed - Mag trace used for sentinel lymph node biopsy.  At least 2 lymph nodes removed - Hemostasis noted at completion of case   Procedure: The patient was brought into the operating room and general anesthesia was induced.  One percent lidocaine  was used for local anesthesia.   The left chest and neck was prepped and draped in the usual sterile fashion.  Preoperative antibiotics were given.  A time-out was completed verifying correct patient, procedure, site, positioning, and implant(s) and/or special equipment prior to beginning this procedure.  Using ultrasound guidance, the access needle was advanced into the left internal jugular vein using the Seldinger technique without difficulty. A guidewire was then advanced into the right atrium under fluoroscopic guidance.  Ectopia was not noted. The skin was knicked.  An incision was made below the left clavicle.  A subcutaneous pocket was formed. The catheter was then tunneled from the pocket to the access site in the neck.  An introducer and peel-away sheath were placed over the guidewire. The catheter was then inserted through the peel-away sheath and the peel-away sheath was removed.  A spot film was performed to confirm the position. The catheter was then attached to the port and the port placed in subcutaneous pocket.  Adequate positioning was confirmed by fluoroscopy.  Hemostasis was confirmed.  Good backflow of blood was noted on aspiration of the port. The port was flushed with heparin  flush. Subcutaneous layer was reapproximated using a 3-0 Vicryl interrupted suture. The skin was closed using a 4-0 Vicryl subcuticular suture. Dermabond was applied.  Drapes were removed, and then attention was turned to the right breast.  A time-out was repeated to verify correct patient, procedure, site, positioning, and  implant(s) and/or special equipment prior to beginning this procedure.  At this time, mag trace was injected below the nipple.   This area was massaged for 5 minutes.  The breast, chest wall, axilla, and upper arm and neck were prepped and draped in the usual sterile fashion.  The timeout was then repeated prior to initiation of the surgery.  A skin incision was made that encompassed the nipple-areola complex and the previous biopsy scar and passed in an oblique direction across the breast.  Skin flaps were raised in the avascular plane between subcutaneous tissue and breast tissue from the clavicle superiorly, the sternum medially, the anterior rectus sheath inferiorly, and past the lateral border of the pectoralis major muscle laterally. Hemostasis was achieved in the flaps. Next, the breast tissue and underlying pectoralis fascia were excised from the pectoralis major muscle, progressing from medially to laterally. At the lateral border of the pectoralis major muscle, the breast tissue was swung laterally and a lateral pedicle identified where breast tissue gave way to fat of axilla. The lateral pedicle was incised and the specimen removed.   Specimen marked long lateral, short superior and sent to pathology.  On inspection of the breast, there was a posterior mass that was palpable at the 4 to 5 o'clock position.  There was some nodularity along the pectoralis muscle, so an additional posterior margin was removed and sent to pathology for evaluation.  Attention was then turned to the left axilla to complete the sentinel lymph node biopsy.  The nipple measured 98000 with the Magtrace probe.  Hand-held Magtrace probe was used to identify the location of the hottest spot in the axilla, through the lateral edge of the original incision.  Sharp and blunt dissection was carried down through the clavipectoral fascia. The probe was placed within wound and again, the point of maximal count was found. Dissection continue until nodule was identified. The probe was placed in contact with the node and 800 counts were recorded. The node was  excised in its entirety. Ex vivo, the sentinel lymph node measured 880 counts when placed on the probe. The axillary bed was again checked, and and additional hotspot was noted.  An additional lymph node was excised with ex vivo measurements of 182.  After this, there were no additional hotspots, enlarged lymph nodes greater than 1 cm, or lymph nodes demonstrating color change noted within the axillary bed.  The lymph nodes were all sent individually for evaluation by pathology.  The wound was irrigated and hemostasis was achieved.  Closed suction drains were brought into the operative field through a separate stab incision and sutured to the skin with a 3-0 nylon suture. The wound was closed with interrupted 2-0 Vicryl and 3-0 Vicryl to the subcutaneous layer, followed by staples. The wound was dressed with Vaseline gauze, 4 x 4's, and ABD pads with Medipore tape.  Drain site covered with drain sponge and secured with Medipore tape.  Final inspection revealed acceptable hemostasis. All counts were correct at the end of the case. The patient was awakened from anesthesia and extubated without complication.  The patient went to the PACU in stable condition.  Dorothyann Brittle, DO  [facility] [address] [place], [place] [address] [phone] (office)

## 2025-01-02 NOTE — Brief Op Note (Signed)
 01/02/2025  9:39 AM  2:26 PM  PATIENT:  Alexis Webb  59 y.o. female  PRE-OPERATIVE DIAGNOSIS:  INVASIVE DUCTAL CARCINOMA , BREAST, RIGHT  ER PR NEGATIVE  POST-OPERATIVE DIAGNOSIS:  INVASIVE DUCTAL CARCINOMA , BREAST, RIGHT ER PR NEGATIVE  PROCEDURE:  Procedures: MASTECTOMY WITH SENTINEL LYMPH NODE BIOPSY (Right) INSERTION, TUNNELED CENTRAL VENOUS DEVICE, WITH PORT (Left)  SURGEON:  Surgeons and Role:    * Samariah Hokenson, Dorothyann LABOR, DO - Primary  ASSISTANTS: Montie Seltzer, RNFA   ANESTHESIA:   general  EBL:  60 mL   BLOOD ADMINISTERED:none  DRAINS: Jackson-Pratt drain(s) with closed bulb suction in the right chest  LOCAL MEDICATIONS USED:  MARCAINE      SPECIMEN:  Source of Specimen:  Right breast mass (short stitch superior, long stitch lateral); right breast posterior margin posterior to palpable mass; right axillary sentinel lymph node measuring 880; right axillary lymph node measuring 182   DISPOSITION OF SPECIMEN:  PATHOLOGY  COUNTS:  YES  DICTATION: .Note written in EPIC  PLAN OF CARE: Admit for overnight observation  PATIENT DISPOSITION:  PACU - hemodynamically stable.   Delay start of Pharmacological VTE agent (>24hrs) due to surgical blood loss or risk of bleeding: no

## 2025-01-02 NOTE — Progress Notes (Signed)
 New port site with skin glue on left upper chest clean, dry and intact.

## 2025-01-02 NOTE — Progress Notes (Signed)
 Patient very lethargic. Oxygen sats dropping O2 placed via face mask.

## 2025-01-02 NOTE — Anesthesia Procedure Notes (Signed)
 Procedure Name: Intubation Date/Time: 01/02/2025 10:14 AM  Performed by: Para Jerelene CROME, CRNAPre-anesthesia Checklist: Patient identified, Emergency Drugs available, Suction available and Patient being monitored Patient Re-evaluated:Patient Re-evaluated prior to induction Oxygen Delivery Method: Circle system utilized Preoxygenation: Pre-oxygenation with 100% oxygen Induction Type: IV induction Ventilation: Mask ventilation without difficulty Laryngoscope Size: Mac and 4 Grade View: Grade II Tube type: Oral Tube size: 7.0 mm Number of attempts: 1 Airway Equipment and Method: Stylet Placement Confirmation: ETT inserted through vocal cords under direct vision, positive ETCO2, CO2 detector and breath sounds checked- equal and bilateral Secured at: 22 (OETT secured 22 cm at lower lip.) cm Tube secured with: Tape Dental Injury: Teeth and Oropharynx as per pre-operative assessment  Comments: Atraumatic intubation x 1. Lips and teeth remain in preoperative condition.

## 2025-01-03 LAB — BASIC METABOLIC PANEL WITH GFR
Anion gap: 10 (ref 5–15)
BUN: 13 mg/dL (ref 6–20)
CO2: 28 mmol/L (ref 22–32)
Calcium: 8.6 mg/dL — ABNORMAL LOW (ref 8.9–10.3)
Chloride: 106 mmol/L (ref 98–111)
Creatinine, Ser: 0.57 mg/dL (ref 0.44–1.00)
GFR, Estimated: >60 mL/min
Glucose, Bld: 152 mg/dL — ABNORMAL HIGH (ref 70–99)
Potassium: 4.3 mmol/L (ref 3.5–5.1)
Sodium: 144 mmol/L (ref 135–145)

## 2025-01-03 LAB — CBC
HCT: 30.6 % — ABNORMAL LOW (ref 36.0–46.0)
Hemoglobin: 9.9 g/dL — ABNORMAL LOW (ref 12.0–15.0)
MCH: 29.6 pg (ref 26.0–34.0)
MCHC: 32.4 g/dL (ref 30.0–36.0)
MCV: 91.3 fL (ref 80.0–100.0)
Platelets: 177 10*3/uL (ref 150–400)
RBC: 3.35 MIL/uL — ABNORMAL LOW (ref 3.87–5.11)
RDW: 14.1 % (ref 11.5–15.5)
WBC: 11.4 10*3/uL — ABNORMAL HIGH (ref 4.0–10.5)
nRBC: 0 % (ref 0.0–0.2)

## 2025-01-03 NOTE — Plan of Care (Signed)
" °  Problem: Education: Goal: Knowledge of General Education information will improve Description: Including pain rating scale, medication(s)/side effects and non-pharmacologic comfort measures Outcome: Progressing   Problem: Health Behavior/Discharge Planning: Goal: Ability to manage health-related needs will improve Outcome: Progressing   Problem: Clinical Measurements: Goal: Ability to maintain clinical measurements within normal limits will improve Outcome: Progressing Goal: Will remain free from infection Outcome: Progressing Goal: Diagnostic test results will improve Outcome: Progressing Goal: Respiratory complications will improve Outcome: Progressing Goal: Cardiovascular complication will be avoided Outcome: Progressing   Problem: Activity: Goal: Risk for activity intolerance will decrease Outcome: Progressing   Problem: Nutrition: Goal: Adequate nutrition will be maintained Outcome: Progressing   Problem: Coping: Goal: Level of anxiety will decrease Outcome: Progressing   Problem: Elimination: Goal: Will not experience complications related to bowel motility Outcome: Progressing Goal: Will not experience complications related to urinary retention Outcome: Progressing   Problem: Pain Managment: Goal: General experience of comfort will improve and/or be controlled Outcome: Progressing   Problem: Safety: Goal: Ability to remain free from injury will improve Outcome: Progressing   Problem: Education: Goal: Knowledge of the prescribed therapeutic regimen will improve Outcome: Progressing   Problem: Bowel/Gastric: Goal: Gastrointestinal status for postoperative course will improve Outcome: Progressing   Problem: Nutritional: Goal: Will attain and maintain optimal nutritional status Outcome: Progressing   Problem: Neurological: Goal: Will regain or maintain usual level of consciousness Outcome: Progressing   Problem: Clinical Measurements: Goal: Ability  to maintain clinical measurements within normal limits Outcome: Progressing Goal: Postoperative complications will be avoided or minimized Outcome: Progressing   Problem: Respiratory: Goal: Will regain and/or maintain adequate ventilation Outcome: Progressing Goal: Respiratory status will improve Outcome: Progressing   Problem: Urinary Elimination: Goal: Will remain free from infection Outcome: Progressing Goal: Ability to achieve and maintain adequate urine output Outcome: Progressing   "

## 2025-01-03 NOTE — Discharge Summary (Signed)
 Physician Discharge Summary  Patient ID: Alexis Webb MRN: [id number] DOB/AGE: 09-Aug-1966 59 y.o.  Admit date: 01/02/2025 Discharge date: 01/03/2025  Admission Diagnoses: Right breast cancer   Discharge Diagnoses:  Principal Problem:   S/P mastectomy, right   Discharged Condition: good  Hospital Course: Alexis Webb is a 59 yo who has right breast cancer and underwent a mastectomy and sentinel lymph node biopsy and port placement. She stayed overnight for monitoring and pain control. She is doing well this AM and has no questions or complaints. She is able to care for her drain.    Consults: None  Significant Diagnostic Studies:  Lab Results  Component Value Date   WBC 11.4 (H) 01/03/2025   HGB 9.9 (L) 01/03/2025   HCT 30.6 (L) 01/03/2025   MCV 91.3 01/03/2025   PLT 177 01/03/2025   H&H is down with blood loss, the dress is dry and intact, JP with bloody clots 120cc evacuated overnight, no active signs of bleeding   Treatments: Right mastectomy, sentinel lymph node biopsy, port placement 01/02/25   Discharge Exam: Blood pressure 104/67, pulse 92, temperature 97.6 F (36.4 C), temperature source Oral, resp. rate 16, height 5' 5 (1.651 m), weight 96.6 kg, SpO2 98%. General appearance: alert and no distress Chest wall: right chest wall with dressing, c/d/I with no staining, JP stripped with some clots in bulb, thinning in the line   Disposition: Discharge disposition: 01-Home or Self Care       Discharge Instructions     Call MD for:  difficulty breathing, headache or visual disturbances   Complete by: As directed    Call MD for:  extreme fatigue   Complete by: As directed    Call MD for:  persistant dizziness or light-headedness   Complete by: As directed    Call MD for:  persistant nausea and vomiting   Complete by: As directed    Call MD for:  persistant nausea and vomiting   Complete by: As directed    Call MD for:  redness, tenderness, or signs of infection  (pain, swelling, redness, odor or green/yellow discharge around incision site)   Complete by: As directed    Call MD for:  redness, tenderness, or signs of infection (pain, swelling, redness, odor or green/yellow discharge around incision site)   Complete by: As directed    Call MD for:  severe uncontrolled pain   Complete by: As directed    Call MD for:  severe uncontrolled pain   Complete by: As directed    Call MD for:  temperature >100.4   Complete by: As directed    Call MD for:  temperature >100.4   Complete by: As directed    Increase activity slowly   Complete by: As directed       Allergies as of 01/03/2025   No Known Allergies      Medication List     TAKE these medications    acetaminophen  500 MG tablet Commonly known as: TYLENOL  Take 2 tablets (1,000 mg total) by mouth every 6 (six) hours for 7 days. What changed:  how much to take when to take this reasons to take this   Cholecalciferol  50 MCG (2000 UT) Tabs Take 4,000 Units by mouth every evening.   docusate sodium  100 MG capsule Commonly known as: Colace Take 1 capsule (100 mg total) by mouth 2 (two) times daily.   DULoxetine  60 MG capsule Commonly known as: CYMBALTA  Take 1 capsule (60 mg total) by  mouth daily.   lidocaine -prilocaine  cream Commonly known as: EMLA  Apply a quarter-sized amount to port a cath site and cover with plastic wrap 1 hour prior to infusion appointments   meloxicam  7.5 MG tablet Commonly known as: Mobic  Take 1-2 tablets (7.5-15 mg total) by mouth daily. What changed:  when to take this reasons to take this   methocarbamol  500 MG tablet Commonly known as: ROBAXIN  Take 1 tablet (500 mg total) by mouth 3 (three) times daily for 7 days.   ondansetron  4 MG tablet Commonly known as: ZOFRAN  Take 1 tablet (4 mg total) by mouth every 8 (eight) hours as needed for nausea or vomiting.   oxyCODONE  5 MG immediate release tablet Commonly known as: Roxicodone  Take 1 tablet (5 mg  total) by mouth every 6 (six) hours as needed.   rosuvastatin  10 MG tablet Commonly known as: CRESTOR  Take 10 mg by mouth at bedtime.        Follow-up Information     Pappayliou, Dorothyann LABOR, DO. Go on 01/08/2025.   Specialty: General Surgery Why: Go to your appointment on 2/5 to check the JP drain Contact information: [address] [place] [facility] [address] [phone]         Pappayliou, Dorothyann LABOR, DO. Go on 01/14/2025.   Specialty: General Surgery Why: Go to your appointment on 2/11 for skin staple removal Contact information: [address] [place] [address] [phone]                 Signed: Manuelita JAYSON Pander 01/03/2025, 9:35 AM

## 2025-01-03 NOTE — Progress Notes (Signed)
 JP drain noted to have a large blood clot in the bottom of the bulb. The JP drain remains patent and continues to function; however, the clot could not be removed, which prevents obtaining an accurate output measurement at this time. Will continue to monitor the drain closely and notify the provider if drainage decreases, stops, or if additional concerns arise.

## 2025-01-04 ENCOUNTER — Encounter (HOSPITAL_COMMUNITY): Payer: Self-pay | Admitting: Surgery

## 2025-01-05 LAB — HIV ANTIBODY (ROUTINE TESTING W REFLEX): HIV Screen 4th Generation wRfx: NONREACTIVE

## 2025-01-07 ENCOUNTER — Telehealth (INDEPENDENT_AMBULATORY_CARE_PROVIDER_SITE_OTHER): Admitting: Surgery

## 2025-01-07 DIAGNOSIS — C50911 Malignant neoplasm of unspecified site of right female breast: Secondary | ICD-10-CM

## 2025-01-07 DIAGNOSIS — Z171 Estrogen receptor negative status [ER-]: Secondary | ICD-10-CM

## 2025-01-07 LAB — SURGICAL PATHOLOGY

## 2025-01-07 NOTE — Telephone Encounter (Signed)
 [facility]  Called patient to update her on the results of her mastectomy and lymph node specimens.  I explained that she the mastectomy specimen had margins negative for cancer.  She also had 3 lymph nodes removed, all of which were negative for cancer.  She will follow up with me tomorrow for JP drain check and possible removal of some staples.  She has been doing well since discharge from the hospital.  All questions answered to her expressed satisfaction.  Pathology: A. 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 - One lymph node negative for malignancy (0/1). - See cancer summary below. - Prior lumpectomy site with scar. - Unremarkable nipple and skin. - Biopsy site change with ribbon clip.  B. RIGHT BREAST, MARGIN POSTERIOR TO PALPABLE MASS: - Skeletal muscle and fibroadipose tissue, negative for malignancy. - See cancer summary below to include final margin status.  C. RIGHT AXILLARY SENTINEL LYMPH NODE 880: - One lymph node negative for malignancy (0/1). - See cancer summary below.  D. RIGHT AXILLARY LYMPH NODE 182: - One lymph node negative for malignancy (0/1). - See cancer summary below.  CASE SUMMARY: INVASIVE CARCINOMA OF THE BREAST Standard(s): AJCC-UICC 8  SPECIMEN Procedure: Mastectomy Specimen Laterality: Right  TUMOR Histologic Type: Invasive carcinoma of no special type (ductal) Histologic Grade (Nottingham Histologic Score)      Glandular (Acinar)/Tubular Differentiation: 3      Nuclear Pleomorphism: 3      Mitotic Rate: 3      Overall Grade: 3 Tumor Size: Greatest dimension of largest invasive focus: 12 mm Ductal Carcinoma In Situ (DCIS): Present, high grade with comedonecrosis Lymphatic and/or Vascular Invasion: Not identified Treatment Effect in the Breast: No known presurgical therapy  MARGINS Margin Status for Invasive Carcinoma: All margins negative for  invasive carcinoma      Distance from closest margin: Greater than 5 mm      Specify closest margin: Posterior  Margin Status for DCIS: All margins negative for DCIS      Distance from DCIS to closest margin: Greater than 7 mm      Specify closest margin: Posterior  REGIONAL LYMPH NODES Regional Lymph Node Status: All regional lymph nodes negative for tumor      Total Number of Lymph Nodes Examined (sentinel and non-sentinel): 3       Number of Sentinel Nodes Examined: 1  DISTANT METASTASIS Distant Site(s) Involved, if applicable: Not applicable  PATHOLOGIC STAGE CLASSIFICATION (pTNM, AJCC 8th Edition): pT Category: pT1c pN Category: pN0      N Suffix: (sn) pM Category: Not applicable  SPECIAL STUDIES Breast Biomarker Testing Performed on Previous Biopsy: [id number]      Estrogen Receptor: Negative (0%) Progesterone Receptor: Negative (0%) Proliferation marker Ki-67: 60% HER2 IHC: Equivocal (2+) HER2 FISH: Positive (group 1)    Dorothyann Brittle, DO [facility] [address] [place], [place] [address] [phone] (office)

## 2025-01-08 ENCOUNTER — Encounter: Payer: Self-pay | Admitting: Surgery

## 2025-01-08 ENCOUNTER — Ambulatory Visit: Admitting: Surgery

## 2025-01-08 VITALS — BP 113/76 | HR 83 | Temp 98.4°F | Resp 12 | Ht 65.0 in | Wt 210.0 lb

## 2025-01-08 DIAGNOSIS — Z09 Encounter for follow-up examination after completed treatment for conditions other than malignant neoplasm: Secondary | ICD-10-CM

## 2025-01-09 NOTE — Progress Notes (Signed)
 "    [facility] Note   HPI:  59 y.o. Female presents to clinic for post-op follow-up status post right breast mastectomy and right axillary sentinel lymph node biopsy with left internal jugular Port-A-Cath insertion on 1/30.  She has been doing well since discharge from the hospital.  She denies any significant pain, and only complains of some mild soreness.  Her JP drain initially was only putting out about 40 cc/day, but yesterday it put out 70 cc, and today she has already drained 30 cc.  The drainage has been serosanguineous.  She is tolerating a diet without nausea and vomiting.  She has no other complaints at this time.  Review of Systems:  All other review of systems: otherwise negative   Vital Signs:  BP 113/76   Pulse 83   Temp 98.4 F (36.9 C) (Oral)   Resp 12   Ht 5' 5 (1.651 m)   Wt 210 lb (95.3 kg)   SpO2 93%   BMI 34.95 kg/m    Physical Exam:  Physical Exam Vitals reviewed.  Constitutional:      Appearance: Normal appearance.  Chest:     Comments: Right chest incision site C/D/I with skin staples in place, mild ecchymosis of both the superior and inferior flaps with ecchymosis along the middle aspect of the incision site without evidence of erythema, induration, tenderness, or necrosis; JP drain with serosanguineous output (40 to 50 cc/day); left chest port site healing well with Dermabond in place, minimal ecchymosis, nontender to palpation Neurological:     Mental Status: She is alert.     Laboratory studies: None  Imaging:  None  Pathology: A. 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 - One lymph node negative for malignancy (0/1). - See cancer summary below. - Prior lumpectomy site with scar. - Unremarkable nipple and skin. - Biopsy site change with ribbon clip.  B. RIGHT BREAST, MARGIN POSTERIOR TO PALPABLE MASS: - Skeletal muscle and fibroadipose tissue, negative  for malignancy. - See cancer summary below to include final margin status.  C. RIGHT AXILLARY SENTINEL LYMPH NODE 880: - One lymph node negative for malignancy (0/1). - See cancer summary below.  D. RIGHT AXILLARY LYMPH NODE 182: - One lymph node negative for malignancy (0/1). - See cancer summary below.  CASE SUMMARY: INVASIVE CARCINOMA OF THE BREAST Standard(s): AJCC-UICC 8  SPECIMEN Procedure: Mastectomy Specimen Laterality: Right  TUMOR Histologic Type: Invasive carcinoma of no special type (ductal) Histologic Grade (Nottingham Histologic Score)      Glandular (Acinar)/Tubular Differentiation: 3      Nuclear Pleomorphism: 3      Mitotic Rate: 3      Overall Grade: 3 Tumor Size: Greatest dimension of largest invasive focus: 12 mm Ductal Carcinoma In Situ (DCIS): Present, high grade with comedonecrosis Lymphatic and/or Vascular Invasion: Not identified Treatment Effect in the Breast: No known presurgical therapy  MARGINS Margin Status for Invasive Carcinoma: All margins negative for invasive carcinoma      Distance from closest margin: Greater than 5 mm      Specify closest margin: Posterior  Margin Status for DCIS: All margins negative for DCIS      Distance from DCIS to closest margin: Greater than 7 mm      Specify closest margin: Posterior  REGIONAL LYMPH NODES Regional Lymph Node Status: All regional lymph nodes negative for tumor      Total Number of Lymph  Nodes Examined (sentinel and non-sentinel): 3       Number of Sentinel Nodes Examined: 1  DISTANT METASTASIS Distant Site(s) Involved, if applicable: Not applicable  PATHOLOGIC STAGE CLASSIFICATION (pTNM, AJCC 8th Edition): pT Category: pT1c pN Category: pN0      N Suffix: (sn) pM Category: Not applicable  SPECIAL STUDIES Breast Biomarker Testing Performed on Previous Biopsy: [id number]      Estrogen Receptor: Negative (0%) Progesterone Receptor: Negative (0%) Proliferation marker Ki-67: 60% HER2  IHC: Equivocal (2+) HER2 FISH: Positive (group 1)   Assessment:  59 y.o. yo Female who presents for follow-up status post right breast mastectomy and right axillary sentinel lymph node biopsy with left internal jugular Port-A-Cath insertion on 1/30.  Plan:  - I discussed with the patient that given the amount of JP drain output she currently has, I would recommend maintaining the JP drain at this time.  Will plan for likely removal at her follow-up next week - Some of her staples were removed without issue.  Advised that the ecchymosis will improve with time - Advised to continue to take it easy.  We discussed her pathology results.   - She will follow-up with oncology on 3/2 - Follow up with me next week on 2/11  All of the above recommendations were discussed with the patient, and all of patient's questions were answered to her expressed satisfaction.  Note: Portions of this report may have been transcribed using voice recognition software. Every effort has been made to ensure accuracy; however, inadvertent computerized transcription errors may still be present.   Dorothyann Brittle, DO [facility] [address] [place], [place] [address] [phone] (office) "

## 2025-01-14 ENCOUNTER — Encounter: Admitting: Surgery

## 2025-01-20 ENCOUNTER — Ambulatory Visit

## 2025-02-02 ENCOUNTER — Inpatient Hospital Stay

## 2025-02-02 ENCOUNTER — Inpatient Hospital Stay: Attending: Physician Assistant | Admitting: Oncology

## 2025-02-23 ENCOUNTER — Inpatient Hospital Stay: Admitting: Oncology

## 2025-02-23 ENCOUNTER — Inpatient Hospital Stay

## 2025-10-19 ENCOUNTER — Inpatient Hospital Stay: Payer: Medicare (Managed Care)

## 2025-10-26 ENCOUNTER — Inpatient Hospital Stay: Payer: Medicare (Managed Care) | Admitting: Physician Assistant
